# Patient Record
Sex: Female | Born: 1960 | Race: White | Hispanic: No | Marital: Single | Smoking: Never smoker
Health system: Southern US, Community
[De-identification: ages and names within clinical notes are randomized; demographics above are authoritative.]

## PROBLEM LIST (undated history)

## (undated) DIAGNOSIS — J45909 Unspecified asthma, uncomplicated: Secondary | ICD-10-CM

## (undated) DIAGNOSIS — K219 Gastro-esophageal reflux disease without esophagitis: Secondary | ICD-10-CM

## (undated) DIAGNOSIS — I10 Essential (primary) hypertension: Secondary | ICD-10-CM

## (undated) DIAGNOSIS — F419 Anxiety disorder, unspecified: Secondary | ICD-10-CM

## (undated) DIAGNOSIS — E119 Type 2 diabetes mellitus without complications: Secondary | ICD-10-CM

## (undated) DIAGNOSIS — M1990 Unspecified osteoarthritis, unspecified site: Secondary | ICD-10-CM

## (undated) HISTORY — PX: TONSILLECTOMY: SUR1361

## (undated) HISTORY — PX: OTHER SURGICAL HISTORY: SHX169

---

## 1999-08-04 ENCOUNTER — Ambulatory Visit (HOSPITAL_COMMUNITY): Admission: RE | Admit: 1999-08-04 | Discharge: 1999-08-04 | Payer: Self-pay | Admitting: Orthopaedic Surgery

## 2001-04-05 ENCOUNTER — Ambulatory Visit (HOSPITAL_BASED_OUTPATIENT_CLINIC_OR_DEPARTMENT_OTHER): Admission: RE | Admit: 2001-04-05 | Discharge: 2001-04-05 | Payer: Self-pay | Admitting: Orthopaedic Surgery

## 2005-09-24 ENCOUNTER — Encounter: Admission: RE | Admit: 2005-09-24 | Discharge: 2005-09-24 | Payer: Self-pay | Admitting: Orthopaedic Surgery

## 2005-10-19 ENCOUNTER — Encounter: Admission: RE | Admit: 2005-10-19 | Discharge: 2005-10-19 | Payer: Self-pay | Admitting: Orthopaedic Surgery

## 2005-11-05 ENCOUNTER — Encounter: Admission: RE | Admit: 2005-11-05 | Discharge: 2005-11-05 | Payer: Self-pay | Admitting: Orthopaedic Surgery

## 2005-11-26 ENCOUNTER — Encounter: Admission: RE | Admit: 2005-11-26 | Discharge: 2005-11-26 | Payer: Self-pay | Admitting: Orthopaedic Surgery

## 2006-04-20 HISTORY — PX: CHOLECYSTECTOMY: SHX55

## 2006-04-20 HISTORY — PX: OTHER SURGICAL HISTORY: SHX169

## 2006-10-05 ENCOUNTER — Encounter: Admission: RE | Admit: 2006-10-05 | Discharge: 2006-10-05 | Payer: Self-pay | Admitting: Orthopaedic Surgery

## 2006-10-25 ENCOUNTER — Encounter: Admission: RE | Admit: 2006-10-25 | Discharge: 2006-10-25 | Payer: Self-pay | Admitting: Orthopaedic Surgery

## 2006-11-08 ENCOUNTER — Encounter: Admission: RE | Admit: 2006-11-08 | Discharge: 2006-11-08 | Payer: Self-pay | Admitting: Orthopaedic Surgery

## 2007-01-18 ENCOUNTER — Encounter (INDEPENDENT_AMBULATORY_CARE_PROVIDER_SITE_OTHER): Payer: Self-pay | Admitting: General Surgery

## 2007-01-18 ENCOUNTER — Inpatient Hospital Stay (HOSPITAL_COMMUNITY): Admission: EM | Admit: 2007-01-18 | Discharge: 2007-01-20 | Payer: Self-pay | Admitting: Emergency Medicine

## 2007-03-28 ENCOUNTER — Emergency Department (HOSPITAL_COMMUNITY): Admission: EM | Admit: 2007-03-28 | Discharge: 2007-03-28 | Payer: Self-pay | Admitting: Family Medicine

## 2007-03-31 ENCOUNTER — Emergency Department (HOSPITAL_COMMUNITY): Admission: EM | Admit: 2007-03-31 | Discharge: 2007-03-31 | Payer: Self-pay | Admitting: Family Medicine

## 2007-06-10 ENCOUNTER — Encounter: Admission: RE | Admit: 2007-06-10 | Discharge: 2007-06-10 | Payer: Self-pay | Admitting: Orthopaedic Surgery

## 2007-07-01 ENCOUNTER — Encounter: Admission: RE | Admit: 2007-07-01 | Discharge: 2007-07-01 | Payer: Self-pay | Admitting: Orthopaedic Surgery

## 2007-07-15 ENCOUNTER — Encounter: Admission: RE | Admit: 2007-07-15 | Discharge: 2007-07-15 | Payer: Self-pay | Admitting: Orthopaedic Surgery

## 2008-01-12 ENCOUNTER — Encounter: Admission: RE | Admit: 2008-01-12 | Discharge: 2008-01-12 | Payer: Self-pay | Admitting: Orthopaedic Surgery

## 2008-01-25 ENCOUNTER — Encounter: Admission: RE | Admit: 2008-01-25 | Discharge: 2008-01-25 | Payer: Self-pay | Admitting: Orthopaedic Surgery

## 2008-01-30 ENCOUNTER — Encounter: Admission: RE | Admit: 2008-01-30 | Discharge: 2008-01-30 | Payer: Self-pay | Admitting: Orthopaedic Surgery

## 2008-06-17 IMAGING — CR DG CHEST 2V
2 series · 2 of 2 positions shown · non-contrast
Comparison: 01/18/07.

CLINICAL DATA: Chest tightness and shortness of breath.
 CHEST - 2 VIEW:

[view not recorded (1 of 2)]
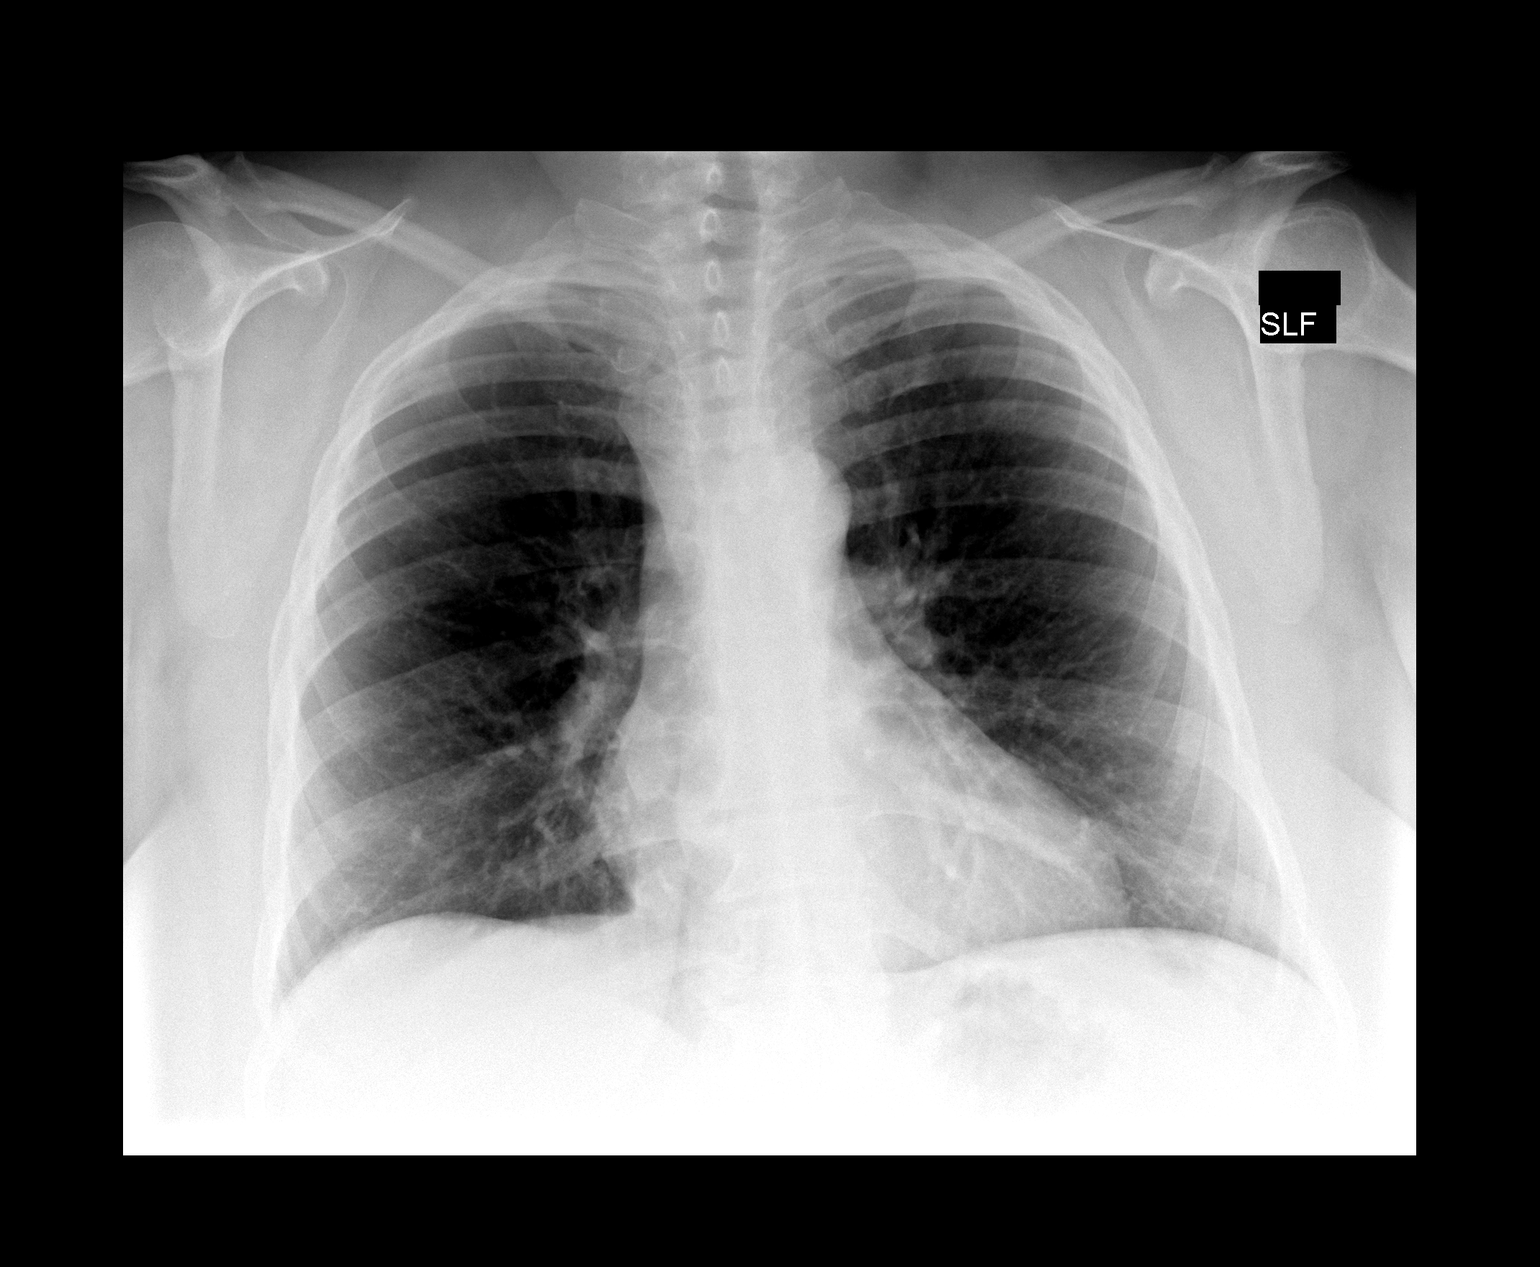

[view not recorded (2 of 2)]
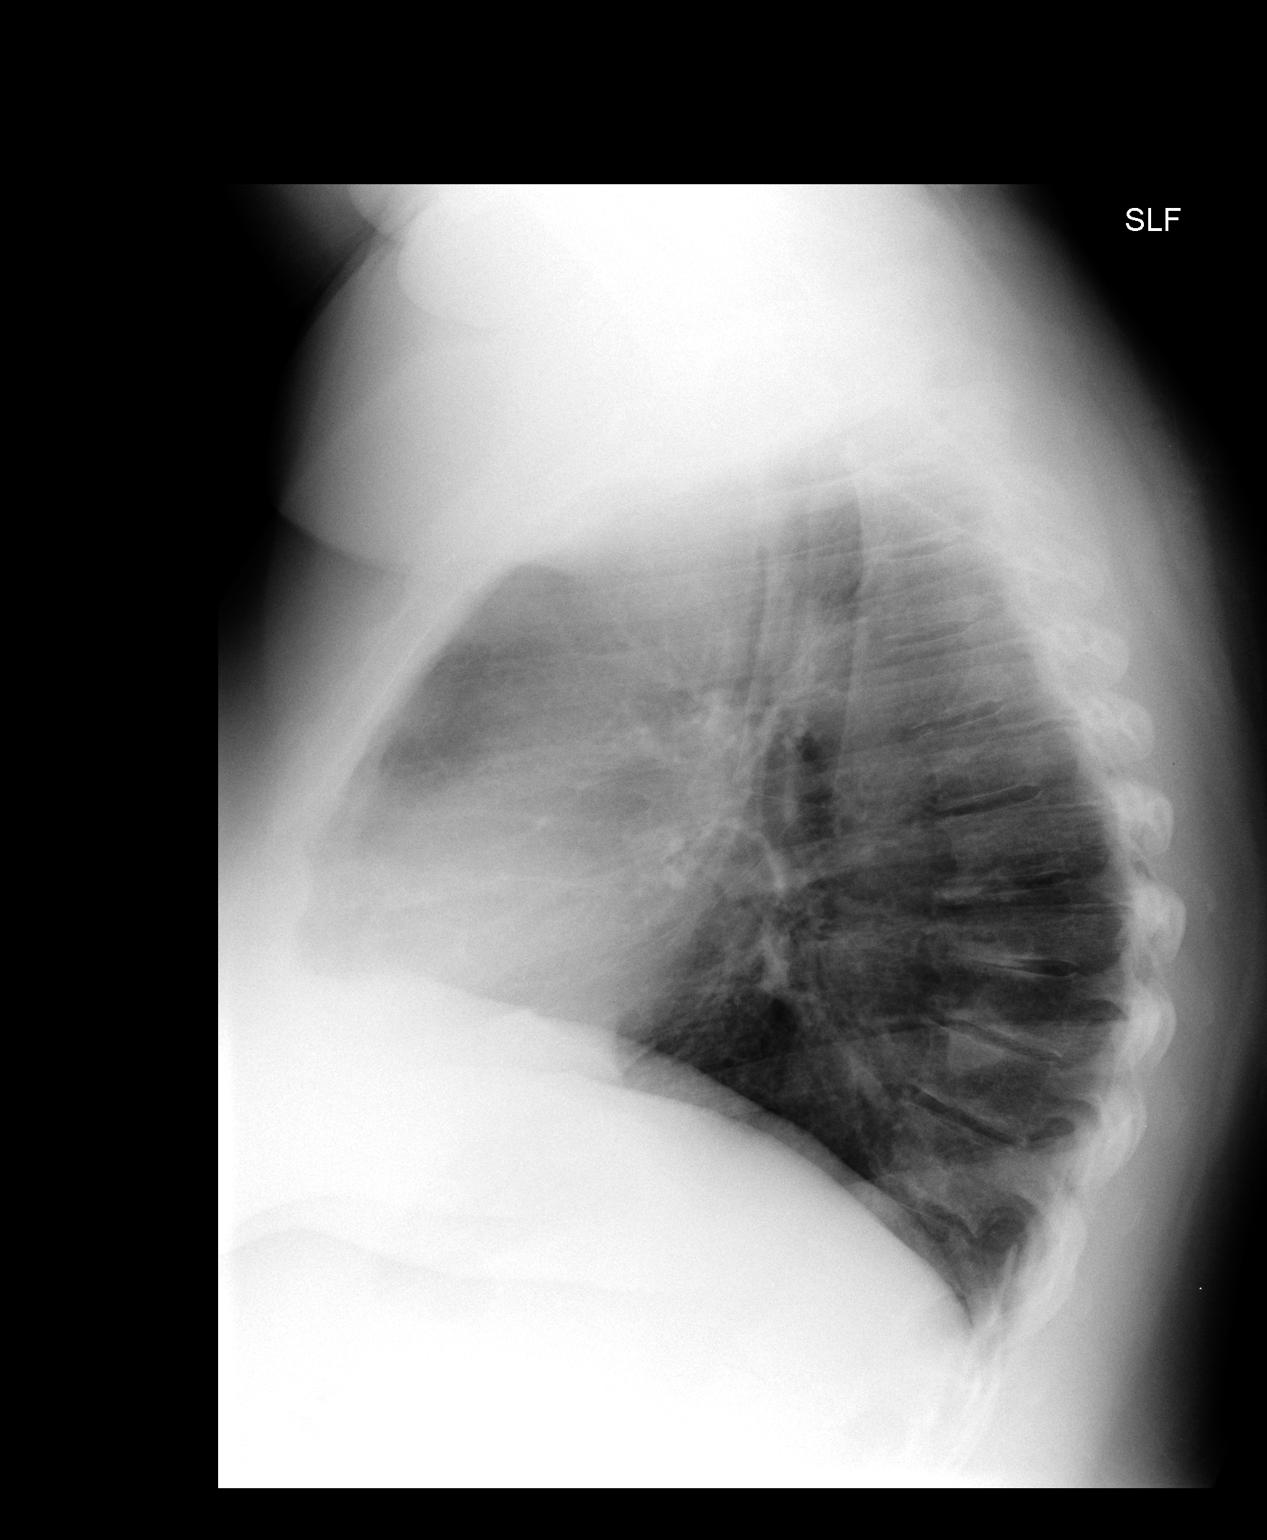

[2 of 2 positions shown; findings below may reference images not displayed]

FINDINGS: The lungs are clear. Heart size is normal. There is no pleural effusion or focal bony abnormality.
IMPRESSION: No acute disease.

## 2008-07-06 ENCOUNTER — Encounter: Admission: RE | Admit: 2008-07-06 | Discharge: 2008-07-06 | Payer: Self-pay | Admitting: Orthopaedic Surgery

## 2009-08-09 ENCOUNTER — Encounter: Admission: RE | Admit: 2009-08-09 | Discharge: 2009-08-09 | Payer: Self-pay | Admitting: Orthopaedic Surgery

## 2009-08-23 ENCOUNTER — Encounter: Admission: RE | Admit: 2009-08-23 | Discharge: 2009-08-23 | Payer: Self-pay | Admitting: Orthopaedic Surgery

## 2009-09-20 ENCOUNTER — Encounter: Admission: RE | Admit: 2009-09-20 | Discharge: 2009-09-20 | Payer: Self-pay | Admitting: Orthopaedic Surgery

## 2010-04-01 ENCOUNTER — Encounter
Admission: RE | Admit: 2010-04-01 | Discharge: 2010-04-01 | Payer: Self-pay | Source: Home / Self Care | Attending: Orthopaedic Surgery | Admitting: Orthopaedic Surgery

## 2010-05-11 ENCOUNTER — Encounter: Payer: Self-pay | Admitting: Orthopaedic Surgery

## 2010-05-11 ENCOUNTER — Encounter: Payer: Self-pay | Admitting: *Deleted

## 2010-05-13 ENCOUNTER — Encounter
Admission: RE | Admit: 2010-05-13 | Discharge: 2010-05-19 | Payer: Self-pay | Source: Home / Self Care | Attending: Orthopaedic Surgery | Admitting: Orthopaedic Surgery

## 2010-05-21 ENCOUNTER — Ambulatory Visit: Payer: BC Managed Care – PPO | Attending: Orthopaedic Surgery | Admitting: Physical Therapy

## 2010-05-21 DIAGNOSIS — M25569 Pain in unspecified knee: Secondary | ICD-10-CM | POA: Insufficient documentation

## 2010-05-21 DIAGNOSIS — M25669 Stiffness of unspecified knee, not elsewhere classified: Secondary | ICD-10-CM | POA: Insufficient documentation

## 2010-05-21 DIAGNOSIS — M545 Low back pain, unspecified: Secondary | ICD-10-CM | POA: Insufficient documentation

## 2010-05-21 DIAGNOSIS — IMO0001 Reserved for inherently not codable concepts without codable children: Secondary | ICD-10-CM | POA: Insufficient documentation

## 2010-05-22 ENCOUNTER — Ambulatory Visit: Payer: BC Managed Care – PPO | Admitting: Physical Therapy

## 2010-05-26 ENCOUNTER — Ambulatory Visit: Payer: BC Managed Care – PPO | Admitting: Physical Therapy

## 2010-05-28 ENCOUNTER — Ambulatory Visit: Payer: BC Managed Care – PPO | Admitting: Physical Therapy

## 2010-06-03 ENCOUNTER — Ambulatory Visit: Payer: BC Managed Care – PPO | Admitting: Physical Therapy

## 2010-06-05 ENCOUNTER — Ambulatory Visit: Payer: BC Managed Care – PPO | Admitting: Physical Therapy

## 2010-06-09 ENCOUNTER — Ambulatory Visit: Payer: BC Managed Care – PPO | Admitting: Rehabilitation

## 2010-06-09 ENCOUNTER — Ambulatory Visit: Payer: BC Managed Care – PPO | Admitting: Physical Therapy

## 2010-06-10 ENCOUNTER — Encounter: Payer: BC Managed Care – PPO | Admitting: Physical Therapy

## 2010-06-11 ENCOUNTER — Ambulatory Visit: Payer: BC Managed Care – PPO | Admitting: Rehabilitation

## 2010-06-13 ENCOUNTER — Encounter: Payer: BC Managed Care – PPO | Admitting: Physical Therapy

## 2010-06-16 ENCOUNTER — Other Ambulatory Visit: Payer: Self-pay | Admitting: Orthopaedic Surgery

## 2010-06-16 DIAGNOSIS — M5126 Other intervertebral disc displacement, lumbar region: Secondary | ICD-10-CM

## 2010-06-16 DIAGNOSIS — IMO0002 Reserved for concepts with insufficient information to code with codable children: Secondary | ICD-10-CM

## 2010-06-17 ENCOUNTER — Ambulatory Visit: Payer: BC Managed Care – PPO | Admitting: Physical Therapy

## 2010-06-18 ENCOUNTER — Ambulatory Visit
Admission: RE | Admit: 2010-06-18 | Discharge: 2010-06-18 | Disposition: A | Discharge status: Home / Self Care | Payer: BC Managed Care – PPO | Source: Ambulatory Visit | Attending: Orthopaedic Surgery | Admitting: Orthopaedic Surgery

## 2010-06-18 ENCOUNTER — Other Ambulatory Visit: Payer: Self-pay | Admitting: Orthopaedic Surgery

## 2010-06-18 ENCOUNTER — Encounter: Payer: BC Managed Care – PPO | Admitting: Rehabilitation

## 2010-06-18 DIAGNOSIS — IMO0002 Reserved for concepts with insufficient information to code with codable children: Secondary | ICD-10-CM

## 2010-06-18 DIAGNOSIS — M5126 Other intervertebral disc displacement, lumbar region: Secondary | ICD-10-CM

## 2010-06-18 DIAGNOSIS — M5136 Other intervertebral disc degeneration, lumbar region: Secondary | ICD-10-CM

## 2010-06-23 ENCOUNTER — Ambulatory Visit: Payer: BC Managed Care – PPO

## 2010-06-25 ENCOUNTER — Ambulatory Visit: Payer: BC Managed Care – PPO | Admitting: Rehabilitation

## 2010-06-30 ENCOUNTER — Ambulatory Visit: Payer: BC Managed Care – PPO | Attending: Orthopaedic Surgery | Admitting: Rehabilitation

## 2010-06-30 DIAGNOSIS — M545 Low back pain, unspecified: Secondary | ICD-10-CM | POA: Insufficient documentation

## 2010-06-30 DIAGNOSIS — M25569 Pain in unspecified knee: Secondary | ICD-10-CM | POA: Insufficient documentation

## 2010-06-30 DIAGNOSIS — IMO0001 Reserved for inherently not codable concepts without codable children: Secondary | ICD-10-CM | POA: Insufficient documentation

## 2010-06-30 DIAGNOSIS — M25669 Stiffness of unspecified knee, not elsewhere classified: Secondary | ICD-10-CM | POA: Insufficient documentation

## 2010-07-02 ENCOUNTER — Ambulatory Visit: Payer: BC Managed Care – PPO | Admitting: Physical Therapy

## 2010-07-03 ENCOUNTER — Ambulatory Visit
Admission: RE | Admit: 2010-07-03 | Discharge: 2010-07-03 | Disposition: A | Discharge status: Home / Self Care | Payer: BC Managed Care – PPO | Source: Ambulatory Visit | Attending: Orthopaedic Surgery | Admitting: Orthopaedic Surgery

## 2010-07-03 ENCOUNTER — Other Ambulatory Visit: Payer: Self-pay | Admitting: Orthopaedic Surgery

## 2010-07-03 DIAGNOSIS — IMO0002 Reserved for concepts with insufficient information to code with codable children: Secondary | ICD-10-CM

## 2010-07-03 DIAGNOSIS — M5136 Other intervertebral disc degeneration, lumbar region: Secondary | ICD-10-CM

## 2010-09-02 NOTE — H&P (Signed)
[name], [name]              ACCOUNT NO.:  [id number]   MEDICAL RECORD NO.:  [id number]          PATIENT TYPE:  INP   LOCATION:  5727                         FACILITY:  [facility]   PHYSICIAN:  Sandria Bales. Ezzard Standing, M.D.  DATE OF BIRTH:  02-26-1961   DATE OF ADMISSION:  01/17/2007  DATE OF DISCHARGE:                              HISTORY & PHYSICAL   HISTORY OF ILLNESS:  [name] is a 50 year old white female, patient  of Dr. Flonnie Overman of [facility] who had about two or three months  ago a sore throat. Was first treated with a Z-Pak, then treated with  some amoxicillin. Then, about two weeks ago, she started experiencing  abdominal pain of nonspecific etiology. It has been grumbling pain for 2  weeks. She was seen by Dr. Flonnie Overman who drew a blood count on her and noted  an elevated white blood count, and she was sent to the [facility]  emergency room for further evaluation.   A CT scan in the emergency room showed cholelithiasis with thickened  gallbladder wall consistent with cholecystitis, and I was contacted.   She has a history of gastroesophageal reflux disease, but she has never  had an upper GI or upper endoscopy. She has no history of known stomach  disease, liver disease, pancreatic disease or colon disease. She has had  no prior abdominal surgery.   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   Her current medications include:  1. Nexium.  2. Metformin 1000 mg b.i.d.  3. Hydrochlorothiazide.   REVIEW OF SYSTEMS:  NEUROLOGICAL:  She has no evidence of seizure or  loss of consciousness.  PULMONARY:  She has had a history of asthma, but she says it is under  good control right now. At one time, she was taking chronic steroids for  her asthma which lead to multiple orthopedic problems. CARDIAC:  She has  had hypertension diagnosed since August of 2008 but has had no chest  pain, no angina, no cardiac catheterization.  GASTROINTESTINAL:  See history of  present illness.  UROLOGIC:  She has had no urinary symptoms but was told she had a  urinary tract infection.  MUSCULOSKELETAL:  She has had multiple orthopedic operations involving  both shoulders, bilateral carpal tunnel. She had trouble with her right  hand with a dorsal laceration. She has had surgery. She has had  bilateral knee and ankle problems. Dr. Norlene Campbell has been her  orthopedic doctor, but she says it has been since almost 1991 since she  has really had any orthopedic surgery.   She is unmarried. Her mother is living. Her father is deceased. She  lives by herself. She works as a Animal nutritionist at [employer].   On physical exam, her pulse is 79, blood pressure 105/65, temperature  98.1. She is a well-nourished, obese, white female alert and cooperative  on physical exam.  Her HEENT is unremarkable.  Her neck is supple. I feel no mass or thyromegaly.  Her lungs are clear to auscultation with symmetric breath sounds.  Her heart has  a regular rate and rhythm. I hear no murmur or rub.  Her abdomen, she is obese which limits exam to some extent, but she is  sore in the epigastrium and right upper quadrant, but she has really no  guarding. No rebound. I feel no abdominal mass. I see no abdominal  scars.  EXTREMITIES:  She has scars over her wrists, but she has good strength  in upper and lower extremities.   Labs that I have show a white blood count 17,600 with a hemoglobin of 12  and hematocrit of 37, platelet count of 531,000. Her sodium was 135,  potassium 3.8, chloride 92, CO2 32, BUN 21, creatinine 1.1, glucose 288.  Her AST is 20, her ALT is 22, alkaline phosphatase is 142, total  bilirubin 0.6, lipase is 14, albumin 2.4. Urinalysis shows 21 to 50  white blood cells, nitrite positive.  Her CT scan which I reviewed with Dr. Charlett Nose shows a thickened  gallbladder wall with what appeared to be stones possibly in the neck of  the cystic duct. Her common bile  duct has a normal diameter. Her liver  and pancreas and other organs are grossly unremarkable.   IMPRESSION:  1. Acute/chronic cholecystitis. I placed her on IV antibiotics and IV      fluids n.p.o. Dr. Bertram Savin is the [facility] doctor of the week, and I      will discuss with her during daylight hours about proceeding with      surgery.  I talked to the patient about indications and potential complications of  gallbladder surgery. The potential complications I explained were  infection which I think she already has, bleeding, bowel injury, bile  duct injury as well as the possibility of open surgery. I think she  understands all of this.  1. Urinary tract infection. Antibiotics that cover her gallbladder      probably cover her urine pretty well.  2. Hypertension.  3. Noninsulin-dependent diabetes mellitus. She had apparently      hemoglobin A1c of about 10.5 two months ago.  4. History of asthma which is being controlled.  5. Remote history of steroid use which lead to multiple orthopedic      problems.  6. Obesity.      Sandria Bales. Ezzard Standing, M.D.  Electronically Signed     DHN/MEDQ  D:  01/18/2007  T:  01/18/2007  Job:  [id number]   cc:   Lucita Ferrara, MD  Claude Manges. Cleophas Dunker, M.D.

## 2010-09-02 NOTE — Op Note (Signed)
[name], [name]              ACCOUNT NO.:  [id number]   MEDICAL RECORD NO.:  [id number]          PATIENT TYPE:  INP   LOCATION:  5727                         FACILITY:  [facility]   PHYSICIAN:  Lennie Muckle, MD      DATE OF BIRTH:  Nov 03, 1960   DATE OF PROCEDURE:  01/18/2007  DATE OF DISCHARGE:                               OPERATIVE REPORT   PREOPERATIVE DIAGNOSIS:  Acute cholecystitis.   POSTOPERATIVE DIAGNOSIS:  Acute cholecystitis.   PROCEDURE:  Laparoscopic cholecystectomy with intraoperative  cholangiogram.   SURGEON:  Lennie Muckle, MD   ASSISTANT:  Velora Heckler, MD   SPECIMENS:  Gallbladder.   ESTIMATED BLOOD LOSS:  50 mL   COMPLICATIONS:  No immediate complications.   DRAINS:  A #10 Blake drain was placed within the gallbladder fossa.   INDICATIONS FOR PROCEDURE:  [name] had presented to the emergency  room with onset of right upper quadrant pain.  CT scan was obtained,  which found edematous gallbladder and a stone within the neck with  diagnosis consistent with acute cholecystitis.  Informed consent was  obtained prior to the procedure.   DETAILS OF PROCEDURE:  [name] was identified in the preoperative  holding area and taken to the operating suite where she was placed in  the supine position.  After administration of general endotracheal  anesthesia, her abdomen was prepped and draped in the usual sterile  fashion.  A time out procedure was performed to correctly idenify the  patient and the procedure. A Veress needle was introduced into the  abdominal cavity just beneath the left costochondral margin.  After  adequate insufflation, the Optiview was placed just above the umbilicus  to gain entry into the abdominal cavity.  A camera was placed inside the  abdominal cavity.  There is no evidence of injury upon entry with the  Veress needle or the trocar.  Three additional trocars were placed in  the abdominal wall, 2 in the left and right  costochondral margin, 1 at  the epigastric region.  The gallbladder was encased with omentum.  Using  blunt dissection the thick omental attachment was removed and a large  amount of purulent fluid was drained from the vicinity.  The gallbladder  was very friable and did have some separation from the gallbladder fossa  with manipulation.  Continued dissection proximally with the suction  dissector and blunt dissection brought me to the infundibulum where a  large stone was placed at the neck of the gallbladder.  It was somewhat  difficult to grasp the neck of the gallbladder; however, the fundus of  the gallbladder was able to be retracted up.  Careful dissection of the  peritoneum surrounding the infundibulum did reveal a rather large cystic  duct.  The cystic artery was able to be identified after careful  dissection, which was rather difficult given the adhesions.  There was  some amount of blood loss due to the large amount of inflammatory  changes.  However, the hepatic artery was able to be separated and  clearly visualized the cystic artery.  After fully dissecting around the  cystic duct, I elected to change the #5 trocar at the epigastric region  to a #10 mm trocar.  A clip was placed proximally on the cystic duct and  then partially transected with the laparoscopic scissors.  The [facility]  catheter was then introduced into the cystic duct for the cholangiogram.  Cholangiogram revealed a patent cystic duct and the common bile duct was  clearly identified, as well as the right and left hepatic ducts.  Two  clips were then placed proximally on the cystic duct.  It was fully  transected with the laparoscopic scissors.  The cystic artery was then  clipped proximally and distally and carefully dissected away from the  surrounding tissues.  It was then transected with the laparoscopic  scissors.  Remaining attachments were taken down with electrocautery.  The gallbladder was then placed in  the catch bag and removed from the  supraumbilical incision.  The area was then irrigated with 1.5 liters of  saline.  Due to the amount of oozing of the liver bed during the  dissection, elected to place the #10 flat drain within the gallbladder  bed.  There was no evidence of bile leakage or bleeding at the end of  the case.  The drain was secured with a 3-0 nylon suture.  The fascial  defect was closed with 0 Vicryl suture just above the umbilicus.  The  skin was closed with 4-0 Monocryl and Steri-Strips were placed without a  dressing.  Patient was then extubated and transferred to the  postanesthesia care unit in stable condition.      Lennie Muckle, MD  Electronically Signed     ALA/MEDQ  D:  01/18/2007  T:  01/18/2007  Job:  [id number]

## 2010-09-05 NOTE — Op Note (Signed)
[place]  Patient:    ZURIEL, YEAMAN Visit Number: [id number] MRN: [id number]          Service Type: Attending:  Claude Manges. Cleophas Dunker, M.D. Dictated by:   Claude Manges. Cleophas Dunker, M.D. Proc. Date: 04/05/01                             Operative Report  PREOPERATIVE DIAGNOSIS: 1. Impingement right shoulder with partial rotator cuff tear. 2. Retained fusion plate, right wrist.  POSTOPERATIVE DIAGNOSIS: 1. Impingement right shoulder with partial rotator cuff tear. 2. Retained fusion plate, right wrist.  OPERATION PERFORMED: 1. Arthroscopic debridement right shoulder including anterior glenoid labrum    and partial rotator cuff tears. 2. Arthroscopic subacromial decompression. 3. Removal of a plate from dorsum of right wrist.  SURGEON:  Claude Manges. Cleophas Dunker, M.D.  ANESTHESIA:  General orotracheal.  COMPLICATIONS:  None.  INDICATIONS FOR PROCEDURE:  The patient is a 50 year old female has been followed for problems with her right shoulder since about July  of this year. she has evidence of impingement clinically with an MRI scan demonstrating a high grade partial tear involving the articular surface of the infra and supraspinatus tendons without evidence of a full thickness perforation.  There was no evidence of obvious bony impingement although clinically she has impingement.  There was an intact glenoid labrum and glenohumeral ligaments. She has had cortisone anti-inflammatory medicines but continues to have pain and did not have arthroscopic evaluation.  She has had a successful arthroscopic procedure on her left shoulder.  She also is status post fusion of her right wrist by Dr. Metro Kung and has a painful plate and that plate is to be removed per the previous discussion with Dr. Metro Kung.  DESCRIPTION OF PROCEDURE:  With the patient comfortable on the operating table and under general orotracheal anesthesia, the patient was placed in  a semisitting position and secured to the operating table with the shoulder frame.  The right upper extremity was prepped from the tips of the fingers circumferentially to the base of the neck with DuraPrep.  Sterile draping was performed.  Initial procedure was performed on the shoulder.  A marking pen was used to outline the acromion, the acromioclavicular joint, the coracoid.  A point about a fingerbreadth and a half inferior and medial to the posterior angle of the acromion, a small stab wound was made, prior to which, 0.25% Marcaine with epinephrine was injected.  The arthroscope was easily placed into the shoulder.  Arthroscopic evaluation revealed fraying of the anterior glenoid ligament but without evidence of tearing.  The humeral head was intact.  There was some chondromalacia of the glenoid.  Biceps tendon was intact.  The posterior labrum appeared to be intact.  There were obvious partial tears of the rotator cuff around the biceps tendon around the attachment to the humeral head involving what appeared to be at least supraspinatus and possibly infraspinatus.  These were shaved.  A second portal was established anteriorly and shaving was performed of the shoulder joint at the labrum as well as the partial rotator cuff tears.  The arthroscope was then placed to the subacromial space posteriorly.  The cannula anteriorly and a third portal established in the lateral subacromial space.  An arthroscopic subacromial decompression was then performed using a shaver as well as the Arthrocare wand.  There were some spurs anteriorly which I removed.  I thought I had an excellent decompression  using a 6 mm bur.  The three stab wounds were closed with interrupted 4-0 Ethilon and the wounds infiltrated with 0.25% Marcaine with epinephrine.  A sterile bulky dressing was applied.  A sterile tourniquet was applied to the right upper extremity.  The extremity was elevated and Esmarch  exsanguinated with the proximal tourniquet at 280 mHg.  The previous dorsal incision was utilized and via sharp dissection carried out to subcutaneous tissues.  The incision was extended to the distal metaphysis of the long finger and then proximally in the midline of the wrist along the incisional lines.  Then by blunt dissection, the soft tissue was elevated off the muscle proximally and the tendons distally.  An interval between the ring and long fingers was identified and then carefully incised. The plate was identified.  There were 8 screws.  The distal 7 were removed, the 8th stripped and then I had some difficult removing the head so that the plate could be removed.  The most proximal hole had not been filled with the screw.  I used Dremel bit to remove the head of the screw and then using an osteotome, it was eventually removed along with the plate leaving the threaded portion of the screw in the radius.  The sharp edge was removed to prevent bursa formation and thus pain.  The wound was irrigated with saline and I did not really see any appreciable remaining pieces of metal.  The tendon sheaths were closed with 3-0 and 2-0 Vicryl, the subcutaneous closed with similar material and the skin closed with running 3-0 subcuticular Prolene with Steri-Strips over benzoin.  The wound was infiltrated with 0.25% Marcaine without epinephrine.  Sterile bulky dressing was applied with an Ace bandage and a volar wrist splint and a sling.  The patient tolerated the procedure without complications.  PLAN:  Mepergan Fortis for pain, office one week. Dictated by:   Claude Manges. Cleophas Dunker, M.D. Attending:  Claude Manges. Cleophas Dunker, M.D. DD:  04/05/01 TD:  04/05/01 Job: [id number]

## 2010-09-05 NOTE — Discharge Summary (Signed)
NAMELAPORSHIA, HOGEN              ACCOUNT NO.:  [id number]   MEDICAL RECORD NO.:  [id number]          PATIENT TYPE:  INP   LOCATION:  5727                         FACILITY:  [facility]   PHYSICIAN:  Lennie Muckle, MD      DATE OF BIRTH:  December 06, 1960   DATE OF ADMISSION:  01/17/2007  DATE OF DISCHARGE:  01/20/2007                               DISCHARGE SUMMARY   DISCHARGING PHYSICIAN:  Dr. Freida Busman.   OPERATING PHYSICIAN:  Dr. Freida Busman.   CHIEF COMPLAINT/REASON FOR ADMISSION:  Ms. Spelman is a 50 year old  female patient of Dr. Shea Stakes at [facility]. She has had  nonspecific abdominal pain for about 2 weeks. She went to see Dr. Flonnie Overman  and she was found to have an elevated white count.  She was sent to  [facility] ER for further evaluation.  CT scan demonstrated  cholelithiasis, thickened gall bladder wall and fluid consistent with  acute cholecystitis.  The patient does have a history of GERD but has  never had upper endoscopy done.  She is already on Nexium. She also has  significant history of diabetes and hypertension. On exam her vital  signs were stable. Her abdomen is obese but she sore in the epigastrium  and right upper quadrant with no guarding.  Her white count was 17,600.  LFTs were normal.  Lipase was normal. Dr. Ezzard Standing did review the CT scan  with the radiologist and it looked like she may have stones in the neck  of the cystic duct. The common bile duct was normal diameter. Therefore  the patient was admitted with the following diagnoses:   ADMISSION DIAGNOSES:  1. Acute on chronic cholecystitis.  2. Urinary tract infection based on abnormal urinalysis in the ER.  3. Hypertension, diabetes and obesity.   HOSPITAL COURSE:  The patient was admitted by Dr. Ezzard Standing where she was  placed on n.p.o. status, IV antibiotics and plans were to proceed with  surgical intervention later in the week.   Dr. Bertram Savin assumed care of the patient after Dr. Ezzard Standing admitted  the  patient and the patient was subsequently taken to the OR on  January 18, 2007. Preoperative diagnosis of acute cholecystitis.  Postoperative diagnosis the same.  She underwent a laparoscopic  cholecystectomy with intraoperative cholangiogram. This showed no  problems and the patient was sent back to the general floor to recover.   In the postoperative period, the patient did well except for issues  related to pain control. She did have an elevated hemoglobin A1c of  10.8. She was having itching related to the PCA and did have urinary  retention that required Foley catheter placement. Postoperatively her  abdomen was soft, bowel sounds were present.  She had a JP drain in  place that had bloody drainage. On postop day #1, the patient was  switched to oral pain medications per her request because of the itching  from the PCA. Toradol was added and Zofran was added as well.  By  postoperative day #2, the patient was stable, afebrile, tolerating an  oral diet.  Abdomen was  benign and Dr. Freida Busman felt she was appropriate  for discharge home.   FINAL DISCHARGE DIAGNOSES:  1. Abdominal pain secondary to acute gangrenous cholecystitis.  2. Status post laparoscopic cholecystectomy with negative      intraoperative cholangiogram.  3. Other chronic medical problems as noted.   DISCHARGE MEDICATIONS:  The patient will resume the following home  medications.  1. Benicar 20 mg daily.  2. Hydrochlorothiazide 25 mg daily.  3. Lexapro 40 mg daily.  4. Lipitor 20 mg daily.  5. Metformin 1000 mg b.i.d.  6. Nexium 40 mg daily.  7. Lexapro daily.  8. Albuterol metered dose inhaler as needed.   NEW MEDICATIONS:  1. Percocet 5/325 1-2 tablets every 4 hours as needed for pain.  2. Over-the-counter stool softener as needed for constipation.   DIET:  No restrictions.   WOUND CARE:  Allow any Steri-Strips to fall off.  JP drain was  discontinued prior to being sent home.   ACTIVITY:  Increase  activity slowly.  No lifting for 6 weeks greater  than 20 pounds.   FOLLOW-UP:  She needs to see Dr. Freida Busman in 2 weeks.  She needs to call  for an appointment.  She also needs to follow up with her primary care  physician in 1 week.      Allison L. Kennith Center, MD  Electronically Signed    ALE/MEDQ  D:  02/16/2007  T:  02/17/2007  Job:  [id number]

## 2010-11-21 ENCOUNTER — Other Ambulatory Visit: Payer: Self-pay | Admitting: Orthopaedic Surgery

## 2010-11-21 DIAGNOSIS — IMO0002 Reserved for concepts with insufficient information to code with codable children: Secondary | ICD-10-CM

## 2010-11-25 ENCOUNTER — Ambulatory Visit
Admission: RE | Admit: 2010-11-25 | Discharge: 2010-11-25 | Disposition: A | Discharge status: Home / Self Care | Payer: BC Managed Care – PPO | Source: Ambulatory Visit | Attending: Orthopaedic Surgery | Admitting: Orthopaedic Surgery

## 2010-11-25 DIAGNOSIS — IMO0002 Reserved for concepts with insufficient information to code with codable children: Secondary | ICD-10-CM

## 2010-11-25 MED ORDER — METHYLPREDNISOLONE ACETATE 40 MG/ML INJ SUSP (RADIOLOG
120.0000 mg | Freq: Once | INTRAMUSCULAR | Status: AC
  Administered 2010-11-25: 120 mg via EPIDURAL

## 2010-11-25 MED ORDER — IOHEXOL 180 MG/ML  SOLN
1.0000 mL | Freq: Once | INTRAMUSCULAR | Status: AC | PRN
  Administered 2010-11-25: 1 mL via EPIDURAL

## 2011-01-29 LAB — BASIC METABOLIC PANEL
BUN: 16
Creatinine, Ser: 0.81
GFR calc non Af Amer: >60
Glucose, Bld: 279 — ABNORMAL HIGH
Potassium: 3.6

## 2011-01-29 LAB — DIFFERENTIAL
Basophils Absolute: 0.1
Eosinophils Absolute: 0.1
Eosinophils Relative: 1
Lymphocytes Relative: 15
Lymphs Abs: 2.7
Neutrophils Relative %: 78 — ABNORMAL HIGH

## 2011-01-29 LAB — CBC
HCT: 37.4
Platelets: 531 — ABNORMAL HIGH
RDW: 13.8
WBC: 17.6 — ABNORMAL HIGH

## 2011-01-29 LAB — URINALYSIS, ROUTINE W REFLEX MICROSCOPIC
Hgb urine dipstick: NEGATIVE
Nitrite: POSITIVE — AB
Protein, ur: 100 — AB
Urobilinogen, UA: 1

## 2011-01-29 LAB — COMPREHENSIVE METABOLIC PANEL
ALT: 22
AST: 20
Albumin: 2.4 — ABNORMAL LOW
Alkaline Phosphatase: 142 — ABNORMAL HIGH
GFR calc Af Amer: 59 — ABNORMAL LOW
Potassium: 3.8
Sodium: 135
Total Protein: 6.2

## 2011-01-29 LAB — HEPATIC FUNCTION PANEL
ALT: 24
Albumin: 2.7 — ABNORMAL LOW
Indirect Bilirubin: 0.4
Total Protein: 7.1

## 2011-01-29 LAB — HEMOGLOBIN A1C: Hgb A1c MFr Bld: 10.8 — ABNORMAL HIGH

## 2011-01-29 LAB — URINE CULTURE
Colony Count: NO GROWTH
Culture: NO GROWTH

## 2011-01-29 LAB — SAMPLE TO BLOOD BANK

## 2011-03-30 ENCOUNTER — Other Ambulatory Visit: Payer: Self-pay | Admitting: Orthopaedic Surgery

## 2011-03-30 DIAGNOSIS — M545 Low back pain, unspecified: Secondary | ICD-10-CM

## 2011-04-01 ENCOUNTER — Ambulatory Visit
Admission: RE | Admit: 2011-04-01 | Discharge: 2011-04-01 | Disposition: A | Discharge status: Home / Self Care | Payer: BC Managed Care – PPO | Source: Ambulatory Visit | Attending: Orthopaedic Surgery | Admitting: Orthopaedic Surgery

## 2011-04-01 DIAGNOSIS — M545 Low back pain, unspecified: Secondary | ICD-10-CM

## 2011-04-03 ENCOUNTER — Other Ambulatory Visit: Payer: Self-pay | Admitting: Orthopaedic Surgery

## 2011-04-03 DIAGNOSIS — M5136 Other intervertebral disc degeneration, lumbar region: Secondary | ICD-10-CM

## 2011-04-06 ENCOUNTER — Ambulatory Visit
Admission: RE | Admit: 2011-04-06 | Discharge: 2011-04-06 | Disposition: A | Discharge status: Home / Self Care | Payer: BC Managed Care – PPO | Source: Ambulatory Visit | Attending: Orthopaedic Surgery | Admitting: Orthopaedic Surgery

## 2011-04-06 DIAGNOSIS — M5136 Other intervertebral disc degeneration, lumbar region: Secondary | ICD-10-CM

## 2011-04-06 MED ORDER — METHYLPREDNISOLONE ACETATE 40 MG/ML INJ SUSP (RADIOLOG
120.0000 mg | Freq: Once | INTRAMUSCULAR | Status: AC
  Administered 2011-04-06: 120 mg via EPIDURAL

## 2011-04-06 MED ORDER — IOHEXOL 180 MG/ML  SOLN
1.0000 mL | Freq: Once | INTRAMUSCULAR | Status: AC | PRN
  Administered 2011-04-06: 1 mL via EPIDURAL

## 2013-07-31 ENCOUNTER — Other Ambulatory Visit: Payer: Self-pay | Admitting: Gastroenterology

## 2013-08-14 ENCOUNTER — Encounter (HOSPITAL_COMMUNITY): Payer: Self-pay | Admitting: Pharmacy Technician

## 2013-08-22 ENCOUNTER — Encounter (HOSPITAL_COMMUNITY): Payer: Self-pay | Admitting: *Deleted

## 2013-09-05 ENCOUNTER — Encounter (HOSPITAL_COMMUNITY): Payer: Self-pay | Admitting: *Deleted

## 2013-09-05 ENCOUNTER — Ambulatory Visit (HOSPITAL_COMMUNITY): Payer: BC Managed Care – PPO | Admitting: Anesthesiology

## 2013-09-05 ENCOUNTER — Encounter (HOSPITAL_COMMUNITY): Payer: BC Managed Care – PPO | Admitting: Anesthesiology

## 2013-09-05 ENCOUNTER — Ambulatory Visit (HOSPITAL_COMMUNITY)
Admission: RE | Admit: 2013-09-05 | Discharge: 2013-09-05 | Disposition: A | Discharge status: Home / Self Care | Payer: BC Managed Care – PPO | Source: Ambulatory Visit | Attending: Gastroenterology | Admitting: Gastroenterology

## 2013-09-05 ENCOUNTER — Encounter (HOSPITAL_COMMUNITY)
Admission: RE | Disposition: A | Discharge status: Home / Self Care | Payer: Self-pay | Source: Ambulatory Visit | Attending: Gastroenterology

## 2013-09-05 DIAGNOSIS — Z887 Allergy status to serum and vaccine status: Secondary | ICD-10-CM | POA: Insufficient documentation

## 2013-09-05 DIAGNOSIS — E119 Type 2 diabetes mellitus without complications: Secondary | ICD-10-CM | POA: Insufficient documentation

## 2013-09-05 DIAGNOSIS — R1013 Epigastric pain: Secondary | ICD-10-CM | POA: Insufficient documentation

## 2013-09-05 DIAGNOSIS — R12 Heartburn: Secondary | ICD-10-CM | POA: Insufficient documentation

## 2013-09-05 DIAGNOSIS — IMO0002 Reserved for concepts with insufficient information to code with codable children: Secondary | ICD-10-CM | POA: Insufficient documentation

## 2013-09-05 DIAGNOSIS — Z888 Allergy status to other drugs, medicaments and biological substances status: Secondary | ICD-10-CM | POA: Insufficient documentation

## 2013-09-05 DIAGNOSIS — M1710 Unilateral primary osteoarthritis, unspecified knee: Secondary | ICD-10-CM | POA: Insufficient documentation

## 2013-09-05 DIAGNOSIS — Z91010 Allergy to peanuts: Secondary | ICD-10-CM | POA: Insufficient documentation

## 2013-09-05 DIAGNOSIS — F458 Other somatoform disorders: Secondary | ICD-10-CM | POA: Insufficient documentation

## 2013-09-05 DIAGNOSIS — F411 Generalized anxiety disorder: Secondary | ICD-10-CM | POA: Insufficient documentation

## 2013-09-05 DIAGNOSIS — Z91012 Allergy to eggs: Secondary | ICD-10-CM | POA: Insufficient documentation

## 2013-09-05 DIAGNOSIS — Z1211 Encounter for screening for malignant neoplasm of colon: Secondary | ICD-10-CM | POA: Insufficient documentation

## 2013-09-05 DIAGNOSIS — E7800 Pure hypercholesterolemia, unspecified: Secondary | ICD-10-CM | POA: Insufficient documentation

## 2013-09-05 DIAGNOSIS — J45909 Unspecified asthma, uncomplicated: Secondary | ICD-10-CM | POA: Insufficient documentation

## 2013-09-05 DIAGNOSIS — I10 Essential (primary) hypertension: Secondary | ICD-10-CM | POA: Insufficient documentation

## 2013-09-05 HISTORY — DX: Essential (primary) hypertension: I10

## 2013-09-05 HISTORY — DX: Type 2 diabetes mellitus without complications: E11.9

## 2013-09-05 HISTORY — DX: Unspecified osteoarthritis, unspecified site: M19.90

## 2013-09-05 HISTORY — PX: COLONOSCOPY WITH PROPOFOL: SHX5780

## 2013-09-05 HISTORY — DX: Unspecified asthma, uncomplicated: J45.909

## 2013-09-05 HISTORY — PX: ESOPHAGOGASTRODUODENOSCOPY (EGD) WITH PROPOFOL: SHX5813

## 2013-09-05 HISTORY — DX: Gastro-esophageal reflux disease without esophagitis: K21.9

## 2013-09-05 HISTORY — DX: Anxiety disorder, unspecified: F41.9

## 2013-09-05 LAB — GLUCOSE, CAPILLARY: GLUCOSE-CAPILLARY: 220 mg/dL — AB (ref 70–99)

## 2013-09-05 SURGERY — ESOPHAGOGASTRODUODENOSCOPY (EGD) WITH PROPOFOL
Anesthesia: Monitor Anesthesia Care

## 2013-09-05 MED ORDER — PROPOFOL 10 MG/ML IV BOLUS
INTRAVENOUS | Status: AC
  Filled 2013-09-05: qty 20

## 2013-09-05 MED ORDER — PROPOFOL 10 MG/ML IV BOLUS
INTRAVENOUS | Status: DC | PRN
  Administered 2013-09-05 (×4): 25 mg via INTRAVENOUS
  Administered 2013-09-05 (×2): 50 mg via INTRAVENOUS

## 2013-09-05 MED ORDER — ONDANSETRON HCL 4 MG/2ML IJ SOLN
INTRAMUSCULAR | Status: AC
  Filled 2013-09-05: qty 2

## 2013-09-05 MED ORDER — BUTAMBEN-TETRACAINE-BENZOCAINE 2-2-14 % EX AERO
INHALATION_SPRAY | CUTANEOUS | Status: DC | PRN
  Administered 2013-09-05: 1 via TOPICAL

## 2013-09-05 MED ORDER — PROMETHAZINE HCL 25 MG/ML IJ SOLN: 6.2500 mg | INTRAMUSCULAR | Status: DC | PRN

## 2013-09-05 MED ORDER — LACTATED RINGERS IV SOLN
INTRAVENOUS | Status: DC
  Administered 2013-09-05: 1000 mL via INTRAVENOUS

## 2013-09-05 MED ORDER — SODIUM CHLORIDE 0.9 % IV SOLN: INTRAVENOUS | Status: DC

## 2013-09-05 MED ORDER — LACTATED RINGERS IV SOLN
INTRAVENOUS | Status: DC | PRN
  Administered 2013-09-05: 08:00:00 via INTRAVENOUS

## 2013-09-05 MED ORDER — ONDANSETRON HCL 4 MG/2ML IJ SOLN
INTRAMUSCULAR | Status: DC | PRN
  Administered 2013-09-05: 4 mg via INTRAVENOUS

## 2013-09-05 SURGICAL SUPPLY — 25 items

## 2013-09-05 NOTE — Anesthesia Preprocedure Evaluation (Signed)
Anesthesia Evaluation  Patient identified by MRN, date of birth, ID band Patient awake    Reviewed: Allergy & Precautions, H&P , NPO status , Patient's Chart, lab work & pertinent test results  Airway Mallampati: II TM Distance: >3 FB Neck ROM: Full    Dental no notable dental hx.    Pulmonary asthma ,  breath sounds clear to auscultation  Pulmonary exam normal       Cardiovascular hypertension, Pt. on medications Rhythm:Regular Rate:Normal     Neuro/Psych negative neurological ROS  negative psych ROS   GI/Hepatic Neg liver ROS, GERD-  Medicated,  Endo/Other  diabetes, Insulin Dependent  Renal/GU negative Renal ROS  negative genitourinary   Musculoskeletal negative musculoskeletal ROS (+)   Abdominal   Peds negative pediatric ROS (+)  Hematology negative hematology ROS (+)   Anesthesia Other Findings   Reproductive/Obstetrics negative OB ROS                           Anesthesia Physical Anesthesia Plan  ASA: III  Anesthesia Plan: MAC   Post-op Pain Management:    Induction: Intravenous  Airway Management Planned: Nasal Cannula  Additional Equipment:   Intra-op Plan:   Post-operative Plan:   Informed Consent: I have reviewed the patients History and Physical, chart, labs and discussed the procedure including the risks, benefits and alternatives for the proposed anesthesia with the patient or authorized representative who has indicated his/her understanding and acceptance.     Plan Discussed with: CRNA and Surgeon  Anesthesia Plan Comments:         Anesthesia Quick Evaluation

## 2013-09-05 NOTE — Op Note (Signed)
Problem: Epigastric discomfort. Heartburn. Globus sensation.  Endoscopist: Danise EdgeMartin Stair  Premedication: Propofol 200 mg  Procedure: Diagnostic esophagogastroduodenoscopy The patient was placed in the left lateral decubitus position. The Pentax gastroscope was passed through the posterior hypopharynx into the proximal esophagus without difficulty. The hypopharynx, larynx, and vocal cords appeared normal.  Esophagoscopy: The proximal, mid, and lower segments of the esophageal mucosa appeared normal. The squamocolumnar junction is regular and noted at 35 cm from the incisor teeth. There is no endoscopic evidence for the presence of Barrett's esophagus or erosive esophagitis.  Gastroscopy: Retroflex view of the gastric cardia and fundus was normal. The gastric body, antrum, and pylorus appeared normal.  Duodenoscopy: The duodenal bulb and descending duodenum appeared normal.  Assessment: Normal esophagogastroduodenoscopy  Procedure: Baseline screening colonoscopy Anal inspection and digital rectal exam were normal. The Pentax pediatric colonoscope was introduced into the rectum and easily advanced to the cecum. A normal-appearing ileocecal valve and appendiceal orifice were identified. Colonic preparation for the exam today was good.  Rectum. Normal. Retroflexed view of the distal rectum normal  Sigmoid colon and descending colon. Normal  Splenic flexure. Normal  Transverse colon. Normal  Hepatic flexure. Normal  Ascending colon. Normal  Cecum and ileocecal valve. Normal  Assessment: Normal baseline screening colonoscopy  Recommendation: Schedule repeat screening colonoscopy in 10 years

## 2013-09-05 NOTE — Transfer of Care (Signed)
Immediate Anesthesia Transfer of Care Note  Patient: [name]  Procedure(s) Performed: Procedure(s): ESOPHAGOGASTRODUODENOSCOPY (EGD) WITH PROPOFOL (N/A) COLONOSCOPY WITH PROPOFOL (N/A)  Patient Location: PACU  Anesthesia Type:MAC  Level of Consciousness: awake, alert  and patient cooperative  Airway & Oxygen Therapy: Patient Spontanous Breathing and Patient connected to nasal cannula oxygen  Post-op Assessment: Report given to PACU RN and Post -op Vital signs reviewed and stable  Post vital signs: Reviewed and stable  Complications: No apparent anesthesia complications

## 2013-09-05 NOTE — Discharge Instructions (Addendum)
Gastrointestinal Endoscopy °Care After °Refer to this sheet in the next few weeks. These instructions provide you with information on caring for yourself after your procedure. Your caregiver may also give you more specific instructions. Your treatment has been planned according to current medical practices, but problems sometimes occur. Call your caregiver if you have any problems or questions after your procedure. °HOME CARE INSTRUCTIONS °· If you were given medicine to help you relax (sedative), do not drive, operate machinery, or sign important documents for 24 hours. °· Avoid alcohol and hot or warm beverages for the first 24 hours after the procedure. °· Only take over-the-counter or prescription medicines for pain, discomfort, or fever as directed by your caregiver. You may resume taking your normal medicines unless your caregiver tells you otherwise. Ask your caregiver when you may resume taking medicines that may cause bleeding, such as aspirin, clopidogrel, or warfarin. °· You may return to your normal diet and activities on the day after your procedure, or as directed by your caregiver. Walking may help to reduce any bloated feeling in your abdomen. °· Drink enough fluids to keep your urine clear or pale yellow. °· You may gargle with salt water if you have a sore throat. °SEEK IMMEDIATE MEDICAL CARE IF: °· You have severe nausea or vomiting. °· You have severe abdominal pain, abdominal cramps that last longer than 6 hours, or abdominal swelling (distention). °· You have severe shoulder or back pain. °· You have trouble swallowing. °· You have shortness of breath, your breathing is shallow, or you are breathing faster than normal. °· You have a fever or a rapid heartbeat. °· You vomit blood or material that looks like coffee grounds. °· You have bloody, black, or tarry stools. °MAKE SURE YOU: °· Understand these instructions. °· Will watch your condition. °· Will get help right away if you are not doing  well or get worse. °Document Released: 11/19/2003 Document Revised: 10/06/2011 Document Reviewed: 07/07/2011 °[employer]® Patient Information ©2014 [employer]. ° °Colonoscopy, Care After °Refer to this sheet in the next few weeks. These instructions provide you with information on caring for yourself after your procedure. Your health care provider may also give you more specific instructions. Your treatment has been planned according to current medical practices, but problems sometimes occur. Call your health care provider if you have any problems or questions after your procedure. °WHAT TO EXPECT AFTER THE PROCEDURE  °After your procedure, it is typical to have the following: °· A small amount of blood in your stool. °· Moderate amounts of gas and mild abdominal cramping or bloating. °HOME CARE INSTRUCTIONS °· Do not drive, operate machinery, or sign important documents for 24 hours. °· You may shower and resume your regular physical activities, but move at a slower pace for the first 24 hours. °· Take frequent rest periods for the first 24 hours. °· Walk around or put a warm pack on your abdomen to help reduce abdominal cramping and bloating. °· Drink enough fluids to keep your urine clear or pale yellow. °· You may resume your normal diet as instructed by your health care provider. Avoid heavy or fried foods that are hard to digest. °· Avoid drinking alcohol for 24 hours or as instructed by your health care provider. °· Only take over-the-counter or prescription medicines as directed by your health care provider. °· If a tissue sample (biopsy) was taken during your procedure: °· Do not take aspirin or blood thinners for 7 days, or as instructed   by your health care provider. °· Do not drink alcohol for 7 days, or as instructed by your health care provider. °· Eat soft foods for the first 24 hours. °SEEK MEDICAL CARE IF: °You have persistent spotting of blood in your stool 2 3 days after the procedure. °SEEK  IMMEDIATE MEDICAL CARE IF: °· You have more than a small spotting of blood in your stool. °· You pass large blood clots in your stool. °· Your abdomen is swollen (distended). °· You have nausea or vomiting. °· You have a fever. °· You have increasing abdominal pain that is not relieved with medicine. °Document Released: 11/19/2003 Document Revised: 01/25/2013 Document Reviewed: 12/12/2012 °[employer]® Patient Information ©2014 [employer]. ° ° °

## 2013-09-05 NOTE — Anesthesia Postprocedure Evaluation (Signed)
  Anesthesia Post-op Note  Patient: [name]  Procedure(s) Performed: Procedure(s) (LRB): ESOPHAGOGASTRODUODENOSCOPY (EGD) WITH PROPOFOL (N/A) COLONOSCOPY WITH PROPOFOL (N/A)  Patient Location: PACU  Anesthesia Type: MAC  Level of Consciousness: awake and alert   Airway and Oxygen Therapy: Patient Spontanous Breathing  Post-op Pain: mild  Post-op Assessment: Post-op Vital signs reviewed, Patient's Cardiovascular Status Stable, Respiratory Function Stable, Patent Airway and No signs of Nausea or vomiting  Last Vitals:  Filed Vitals:   09/05/13 0850  BP: 144/76  Pulse: 80  Temp:   Resp: 20    Post-op Vital Signs: stable   Complications: No apparent anesthesia complications

## 2013-09-05 NOTE — H&P (Signed)
  Problems: Epigastric pain. Globus sensation. Chronic heartburn. Screening colonoscopy.  History: The patient is a 53 year old female born 09/07/1960. She is scheduled to undergo her first screening colonoscopy with polypectomy to prevent colon cancer.  The patient has chronic heartburn which seems to be well-controlled on Nexium. She occasionally has a foreign body sensation in her throat (globus sensation) and has developed epigastric discomfort.  She is scheduled to undergo a diagnostic esophagogastroduodenoscopy.  Past medical history: Multiple orthopedic procedures. Lasix  eye surgery. Tonsillectomy. Cholecystectomy. Osteoarthritis of the knees. Chronic anxiety. Lumbar spinal stenosis. Asthma. Hypercholesterolemia. Hypertension. Type 2 diabetes mellitus.  Allergies: Peanuts oil. Actos. Metformin. Eggs. Influenza vaccination. Lisinopril.  Exam: The patient is alert and lying comfortably on the endoscopy stretcher. Lungs are clear to auscultation. Cardiac exam reveals a regular rhythm. Abdomen is soft and nontender to palpation.  Plan: Proceed with diagnostic esophagogastroduodenoscopy followed by screening colonoscopy.

## 2013-09-06 ENCOUNTER — Encounter (HOSPITAL_COMMUNITY): Payer: Self-pay | Admitting: Gastroenterology

## 2014-02-02 ENCOUNTER — Other Ambulatory Visit: Payer: Self-pay

## 2015-08-12 DIAGNOSIS — M75121 Complete rotator cuff tear or rupture of right shoulder, not specified as traumatic: Secondary | ICD-10-CM | POA: Diagnosis not present

## 2015-09-19 DIAGNOSIS — M25561 Pain in right knee: Secondary | ICD-10-CM | POA: Diagnosis not present

## 2015-09-19 DIAGNOSIS — M25562 Pain in left knee: Secondary | ICD-10-CM | POA: Diagnosis not present

## 2015-09-19 DIAGNOSIS — M1711 Unilateral primary osteoarthritis, right knee: Secondary | ICD-10-CM | POA: Diagnosis not present

## 2016-02-26 DIAGNOSIS — M25562 Pain in left knee: Secondary | ICD-10-CM | POA: Diagnosis not present

## 2016-02-26 DIAGNOSIS — M1711 Unilateral primary osteoarthritis, right knee: Secondary | ICD-10-CM | POA: Diagnosis not present

## 2016-02-26 DIAGNOSIS — M25561 Pain in right knee: Secondary | ICD-10-CM | POA: Diagnosis not present

## 2016-04-22 DIAGNOSIS — R05 Cough: Secondary | ICD-10-CM | POA: Diagnosis not present

## 2016-04-22 DIAGNOSIS — J45901 Unspecified asthma with (acute) exacerbation: Secondary | ICD-10-CM | POA: Diagnosis not present

## 2016-05-11 DIAGNOSIS — M79661 Pain in right lower leg: Secondary | ICD-10-CM | POA: Diagnosis not present

## 2016-05-11 DIAGNOSIS — Z79899 Other long term (current) drug therapy: Secondary | ICD-10-CM | POA: Diagnosis not present

## 2016-05-11 DIAGNOSIS — I10 Essential (primary) hypertension: Secondary | ICD-10-CM | POA: Diagnosis not present

## 2016-05-11 DIAGNOSIS — M791 Myalgia: Secondary | ICD-10-CM | POA: Diagnosis not present

## 2016-05-11 DIAGNOSIS — M79604 Pain in right leg: Secondary | ICD-10-CM | POA: Diagnosis not present

## 2016-05-12 DIAGNOSIS — M79604 Pain in right leg: Secondary | ICD-10-CM | POA: Diagnosis not present

## 2016-05-13 DIAGNOSIS — Z791 Long term (current) use of non-steroidal anti-inflammatories (NSAID): Secondary | ICD-10-CM | POA: Diagnosis not present

## 2016-05-13 DIAGNOSIS — I10 Essential (primary) hypertension: Secondary | ICD-10-CM | POA: Diagnosis not present

## 2016-05-13 DIAGNOSIS — Z780 Asymptomatic menopausal state: Secondary | ICD-10-CM | POA: Diagnosis not present

## 2016-05-13 DIAGNOSIS — Z86718 Personal history of other venous thrombosis and embolism: Secondary | ICD-10-CM | POA: Diagnosis not present

## 2016-05-13 DIAGNOSIS — M79604 Pain in right leg: Secondary | ICD-10-CM | POA: Diagnosis not present

## 2016-05-13 DIAGNOSIS — M79661 Pain in right lower leg: Secondary | ICD-10-CM | POA: Diagnosis not present

## 2016-05-13 DIAGNOSIS — J45909 Unspecified asthma, uncomplicated: Secondary | ICD-10-CM | POA: Diagnosis not present

## 2016-05-13 DIAGNOSIS — E119 Type 2 diabetes mellitus without complications: Secondary | ICD-10-CM | POA: Diagnosis not present

## 2016-05-13 DIAGNOSIS — M179 Osteoarthritis of knee, unspecified: Secondary | ICD-10-CM | POA: Diagnosis not present

## 2016-05-18 DIAGNOSIS — M62838 Other muscle spasm: Secondary | ICD-10-CM | POA: Diagnosis not present

## 2016-05-18 DIAGNOSIS — M25561 Pain in right knee: Secondary | ICD-10-CM | POA: Diagnosis not present

## 2016-05-18 DIAGNOSIS — M170 Bilateral primary osteoarthritis of knee: Secondary | ICD-10-CM | POA: Diagnosis not present

## 2016-06-03 DIAGNOSIS — M25562 Pain in left knee: Secondary | ICD-10-CM | POA: Diagnosis not present

## 2016-06-03 DIAGNOSIS — M25561 Pain in right knee: Secondary | ICD-10-CM | POA: Diagnosis not present

## 2016-06-15 DIAGNOSIS — E7800 Pure hypercholesterolemia, unspecified: Secondary | ICD-10-CM | POA: Diagnosis not present

## 2016-06-15 DIAGNOSIS — I10 Essential (primary) hypertension: Secondary | ICD-10-CM | POA: Diagnosis not present

## 2016-06-15 DIAGNOSIS — F329 Major depressive disorder, single episode, unspecified: Secondary | ICD-10-CM | POA: Diagnosis not present

## 2016-06-15 DIAGNOSIS — E119 Type 2 diabetes mellitus without complications: Secondary | ICD-10-CM | POA: Diagnosis not present

## 2016-07-15 DIAGNOSIS — Z6841 Body Mass Index (BMI) 40.0 and over, adult: Secondary | ICD-10-CM | POA: Diagnosis not present

## 2016-07-15 DIAGNOSIS — J45909 Unspecified asthma, uncomplicated: Secondary | ICD-10-CM | POA: Diagnosis not present

## 2016-07-15 DIAGNOSIS — E11618 Type 2 diabetes mellitus with other diabetic arthropathy: Secondary | ICD-10-CM | POA: Diagnosis not present

## 2016-07-15 DIAGNOSIS — Z01818 Encounter for other preprocedural examination: Secondary | ICD-10-CM | POA: Diagnosis not present

## 2016-07-15 DIAGNOSIS — I10 Essential (primary) hypertension: Secondary | ICD-10-CM | POA: Diagnosis not present

## 2016-07-28 DIAGNOSIS — Z7982 Long term (current) use of aspirin: Secondary | ICD-10-CM | POA: Diagnosis not present

## 2016-07-28 DIAGNOSIS — E119 Type 2 diabetes mellitus without complications: Secondary | ICD-10-CM | POA: Diagnosis not present

## 2016-07-28 DIAGNOSIS — M75121 Complete rotator cuff tear or rupture of right shoulder, not specified as traumatic: Secondary | ICD-10-CM | POA: Diagnosis not present

## 2016-07-28 DIAGNOSIS — M1711 Unilateral primary osteoarthritis, right knee: Secondary | ICD-10-CM | POA: Diagnosis not present

## 2016-07-28 DIAGNOSIS — Z96651 Presence of right artificial knee joint: Secondary | ICD-10-CM | POA: Diagnosis not present

## 2016-07-28 DIAGNOSIS — M654 Radial styloid tenosynovitis [de Quervain]: Secondary | ICD-10-CM | POA: Diagnosis not present

## 2016-07-28 DIAGNOSIS — Z86718 Personal history of other venous thrombosis and embolism: Secondary | ICD-10-CM | POA: Diagnosis not present

## 2016-07-28 DIAGNOSIS — Z471 Aftercare following joint replacement surgery: Secondary | ICD-10-CM | POA: Diagnosis not present

## 2016-07-28 DIAGNOSIS — Z791 Long term (current) use of non-steroidal anti-inflammatories (NSAID): Secondary | ICD-10-CM | POA: Diagnosis not present

## 2016-07-28 DIAGNOSIS — I10 Essential (primary) hypertension: Secondary | ICD-10-CM | POA: Diagnosis not present

## 2016-07-28 DIAGNOSIS — J45909 Unspecified asthma, uncomplicated: Secondary | ICD-10-CM | POA: Diagnosis not present

## 2016-07-28 DIAGNOSIS — Z79899 Other long term (current) drug therapy: Secondary | ICD-10-CM | POA: Diagnosis not present

## 2016-07-28 DIAGNOSIS — Z6841 Body Mass Index (BMI) 40.0 and over, adult: Secondary | ICD-10-CM | POA: Diagnosis not present

## 2016-07-28 DIAGNOSIS — F329 Major depressive disorder, single episode, unspecified: Secondary | ICD-10-CM | POA: Diagnosis not present

## 2016-07-28 DIAGNOSIS — K219 Gastro-esophageal reflux disease without esophagitis: Secondary | ICD-10-CM | POA: Diagnosis not present

## 2016-07-28 DIAGNOSIS — Z794 Long term (current) use of insulin: Secondary | ICD-10-CM | POA: Diagnosis not present

## 2016-07-28 DIAGNOSIS — E7800 Pure hypercholesterolemia, unspecified: Secondary | ICD-10-CM | POA: Diagnosis not present

## 2016-08-01 DIAGNOSIS — M1990 Unspecified osteoarthritis, unspecified site: Secondary | ICD-10-CM | POA: Diagnosis not present

## 2016-08-01 DIAGNOSIS — Z86718 Personal history of other venous thrombosis and embolism: Secondary | ICD-10-CM | POA: Diagnosis not present

## 2016-08-01 DIAGNOSIS — Z96651 Presence of right artificial knee joint: Secondary | ICD-10-CM | POA: Diagnosis not present

## 2016-08-01 DIAGNOSIS — Z7951 Long term (current) use of inhaled steroids: Secondary | ICD-10-CM | POA: Diagnosis not present

## 2016-08-01 DIAGNOSIS — Z794 Long term (current) use of insulin: Secondary | ICD-10-CM | POA: Diagnosis not present

## 2016-08-01 DIAGNOSIS — J45909 Unspecified asthma, uncomplicated: Secondary | ICD-10-CM | POA: Diagnosis not present

## 2016-08-01 DIAGNOSIS — Z471 Aftercare following joint replacement surgery: Secondary | ICD-10-CM | POA: Diagnosis not present

## 2016-08-01 DIAGNOSIS — K219 Gastro-esophageal reflux disease without esophagitis: Secondary | ICD-10-CM | POA: Diagnosis not present

## 2016-08-01 DIAGNOSIS — I10 Essential (primary) hypertension: Secondary | ICD-10-CM | POA: Diagnosis not present

## 2016-08-01 DIAGNOSIS — E119 Type 2 diabetes mellitus without complications: Secondary | ICD-10-CM | POA: Diagnosis not present

## 2016-08-04 DIAGNOSIS — Z471 Aftercare following joint replacement surgery: Secondary | ICD-10-CM | POA: Diagnosis not present

## 2016-08-04 DIAGNOSIS — E119 Type 2 diabetes mellitus without complications: Secondary | ICD-10-CM | POA: Diagnosis not present

## 2016-08-04 DIAGNOSIS — Z794 Long term (current) use of insulin: Secondary | ICD-10-CM | POA: Diagnosis not present

## 2016-08-04 DIAGNOSIS — Z96651 Presence of right artificial knee joint: Secondary | ICD-10-CM | POA: Diagnosis not present

## 2016-08-04 DIAGNOSIS — K219 Gastro-esophageal reflux disease without esophagitis: Secondary | ICD-10-CM | POA: Diagnosis not present

## 2016-08-04 DIAGNOSIS — J45909 Unspecified asthma, uncomplicated: Secondary | ICD-10-CM | POA: Diagnosis not present

## 2016-08-04 DIAGNOSIS — M1990 Unspecified osteoarthritis, unspecified site: Secondary | ICD-10-CM | POA: Diagnosis not present

## 2016-08-04 DIAGNOSIS — I10 Essential (primary) hypertension: Secondary | ICD-10-CM | POA: Diagnosis not present

## 2016-08-04 DIAGNOSIS — Z7951 Long term (current) use of inhaled steroids: Secondary | ICD-10-CM | POA: Diagnosis not present

## 2016-08-04 DIAGNOSIS — Z86718 Personal history of other venous thrombosis and embolism: Secondary | ICD-10-CM | POA: Diagnosis not present

## 2016-08-06 DIAGNOSIS — K219 Gastro-esophageal reflux disease without esophagitis: Secondary | ICD-10-CM | POA: Diagnosis not present

## 2016-08-06 DIAGNOSIS — E119 Type 2 diabetes mellitus without complications: Secondary | ICD-10-CM | POA: Diagnosis not present

## 2016-08-06 DIAGNOSIS — Z471 Aftercare following joint replacement surgery: Secondary | ICD-10-CM | POA: Diagnosis not present

## 2016-08-06 DIAGNOSIS — Z794 Long term (current) use of insulin: Secondary | ICD-10-CM | POA: Diagnosis not present

## 2016-08-06 DIAGNOSIS — Z7951 Long term (current) use of inhaled steroids: Secondary | ICD-10-CM | POA: Diagnosis not present

## 2016-08-06 DIAGNOSIS — M1990 Unspecified osteoarthritis, unspecified site: Secondary | ICD-10-CM | POA: Diagnosis not present

## 2016-08-06 DIAGNOSIS — J45909 Unspecified asthma, uncomplicated: Secondary | ICD-10-CM | POA: Diagnosis not present

## 2016-08-06 DIAGNOSIS — I10 Essential (primary) hypertension: Secondary | ICD-10-CM | POA: Diagnosis not present

## 2016-08-06 DIAGNOSIS — Z86718 Personal history of other venous thrombosis and embolism: Secondary | ICD-10-CM | POA: Diagnosis not present

## 2016-08-06 DIAGNOSIS — Z96651 Presence of right artificial knee joint: Secondary | ICD-10-CM | POA: Diagnosis not present

## 2016-08-10 DIAGNOSIS — M25561 Pain in right knee: Secondary | ICD-10-CM | POA: Diagnosis not present

## 2016-08-11 DIAGNOSIS — Z471 Aftercare following joint replacement surgery: Secondary | ICD-10-CM | POA: Diagnosis not present

## 2016-08-11 DIAGNOSIS — M1990 Unspecified osteoarthritis, unspecified site: Secondary | ICD-10-CM | POA: Diagnosis not present

## 2016-08-11 DIAGNOSIS — Z96651 Presence of right artificial knee joint: Secondary | ICD-10-CM | POA: Diagnosis not present

## 2016-08-11 DIAGNOSIS — E119 Type 2 diabetes mellitus without complications: Secondary | ICD-10-CM | POA: Diagnosis not present

## 2016-08-11 DIAGNOSIS — Z794 Long term (current) use of insulin: Secondary | ICD-10-CM | POA: Diagnosis not present

## 2016-08-11 DIAGNOSIS — K219 Gastro-esophageal reflux disease without esophagitis: Secondary | ICD-10-CM | POA: Diagnosis not present

## 2016-08-11 DIAGNOSIS — J45909 Unspecified asthma, uncomplicated: Secondary | ICD-10-CM | POA: Diagnosis not present

## 2016-08-11 DIAGNOSIS — I10 Essential (primary) hypertension: Secondary | ICD-10-CM | POA: Diagnosis not present

## 2016-08-11 DIAGNOSIS — Z86718 Personal history of other venous thrombosis and embolism: Secondary | ICD-10-CM | POA: Diagnosis not present

## 2016-08-11 DIAGNOSIS — Z7951 Long term (current) use of inhaled steroids: Secondary | ICD-10-CM | POA: Diagnosis not present

## 2016-08-17 DIAGNOSIS — M1990 Unspecified osteoarthritis, unspecified site: Secondary | ICD-10-CM | POA: Diagnosis not present

## 2016-08-17 DIAGNOSIS — J45909 Unspecified asthma, uncomplicated: Secondary | ICD-10-CM | POA: Diagnosis not present

## 2016-08-17 DIAGNOSIS — Z794 Long term (current) use of insulin: Secondary | ICD-10-CM | POA: Diagnosis not present

## 2016-08-17 DIAGNOSIS — E119 Type 2 diabetes mellitus without complications: Secondary | ICD-10-CM | POA: Diagnosis not present

## 2016-08-17 DIAGNOSIS — Z96651 Presence of right artificial knee joint: Secondary | ICD-10-CM | POA: Diagnosis not present

## 2016-08-17 DIAGNOSIS — I10 Essential (primary) hypertension: Secondary | ICD-10-CM | POA: Diagnosis not present

## 2016-08-17 DIAGNOSIS — K219 Gastro-esophageal reflux disease without esophagitis: Secondary | ICD-10-CM | POA: Diagnosis not present

## 2016-08-17 DIAGNOSIS — Z86718 Personal history of other venous thrombosis and embolism: Secondary | ICD-10-CM | POA: Diagnosis not present

## 2016-08-17 DIAGNOSIS — Z7951 Long term (current) use of inhaled steroids: Secondary | ICD-10-CM | POA: Diagnosis not present

## 2016-08-17 DIAGNOSIS — Z471 Aftercare following joint replacement surgery: Secondary | ICD-10-CM | POA: Diagnosis not present

## 2016-08-20 DIAGNOSIS — M1711 Unilateral primary osteoarthritis, right knee: Secondary | ICD-10-CM | POA: Diagnosis not present

## 2016-11-06 DIAGNOSIS — Z794 Long term (current) use of insulin: Secondary | ICD-10-CM | POA: Diagnosis not present

## 2016-11-06 DIAGNOSIS — E669 Obesity, unspecified: Secondary | ICD-10-CM | POA: Diagnosis not present

## 2016-11-06 DIAGNOSIS — E1165 Type 2 diabetes mellitus with hyperglycemia: Secondary | ICD-10-CM | POA: Diagnosis not present

## 2016-11-09 DIAGNOSIS — M25561 Pain in right knee: Secondary | ICD-10-CM | POA: Diagnosis not present

## 2016-11-09 DIAGNOSIS — M79645 Pain in left finger(s): Secondary | ICD-10-CM | POA: Diagnosis not present

## 2016-11-09 DIAGNOSIS — M25562 Pain in left knee: Secondary | ICD-10-CM | POA: Diagnosis not present

## 2018-08-23 DIAGNOSIS — E1165 Type 2 diabetes mellitus with hyperglycemia: Secondary | ICD-10-CM | POA: Diagnosis not present

## 2018-08-23 DIAGNOSIS — Z794 Long term (current) use of insulin: Secondary | ICD-10-CM | POA: Diagnosis not present

## 2018-08-23 DIAGNOSIS — E669 Obesity, unspecified: Secondary | ICD-10-CM | POA: Diagnosis not present

## 2018-08-23 DIAGNOSIS — I10 Essential (primary) hypertension: Secondary | ICD-10-CM | POA: Diagnosis not present

## 2018-09-07 DIAGNOSIS — E7800 Pure hypercholesterolemia, unspecified: Secondary | ICD-10-CM | POA: Diagnosis not present

## 2018-09-07 DIAGNOSIS — I10 Essential (primary) hypertension: Secondary | ICD-10-CM | POA: Diagnosis not present

## 2018-09-07 DIAGNOSIS — E119 Type 2 diabetes mellitus without complications: Secondary | ICD-10-CM | POA: Diagnosis not present

## 2018-09-07 DIAGNOSIS — R748 Abnormal levels of other serum enzymes: Secondary | ICD-10-CM | POA: Diagnosis not present

## 2019-01-02 DIAGNOSIS — M25531 Pain in right wrist: Secondary | ICD-10-CM | POA: Diagnosis not present

## 2019-01-02 DIAGNOSIS — M79641 Pain in right hand: Secondary | ICD-10-CM | POA: Diagnosis not present

## 2019-05-16 DIAGNOSIS — F331 Major depressive disorder, recurrent, moderate: Secondary | ICD-10-CM | POA: Diagnosis not present

## 2019-05-22 DIAGNOSIS — F331 Major depressive disorder, recurrent, moderate: Secondary | ICD-10-CM | POA: Diagnosis not present

## 2019-07-25 DIAGNOSIS — F331 Major depressive disorder, recurrent, moderate: Secondary | ICD-10-CM | POA: Diagnosis not present

## 2019-08-23 DIAGNOSIS — F331 Major depressive disorder, recurrent, moderate: Secondary | ICD-10-CM | POA: Diagnosis not present

## 2019-11-13 DIAGNOSIS — F331 Major depressive disorder, recurrent, moderate: Secondary | ICD-10-CM | POA: Diagnosis not present

## 2019-11-17 DIAGNOSIS — E1165 Type 2 diabetes mellitus with hyperglycemia: Secondary | ICD-10-CM | POA: Diagnosis not present

## 2019-11-17 DIAGNOSIS — E669 Obesity, unspecified: Secondary | ICD-10-CM | POA: Diagnosis not present

## 2019-11-17 DIAGNOSIS — Z794 Long term (current) use of insulin: Secondary | ICD-10-CM | POA: Diagnosis not present

## 2019-11-17 DIAGNOSIS — I10 Essential (primary) hypertension: Secondary | ICD-10-CM | POA: Diagnosis not present

## 2019-12-29 DIAGNOSIS — M25552 Pain in left hip: Secondary | ICD-10-CM | POA: Diagnosis not present

## 2019-12-29 DIAGNOSIS — M25562 Pain in left knee: Secondary | ICD-10-CM | POA: Diagnosis not present

## 2020-01-29 DIAGNOSIS — G4489 Other headache syndrome: Secondary | ICD-10-CM | POA: Diagnosis not present

## 2020-01-29 DIAGNOSIS — I10 Essential (primary) hypertension: Secondary | ICD-10-CM | POA: Diagnosis not present

## 2020-01-29 DIAGNOSIS — K219 Gastro-esophageal reflux disease without esophagitis: Secondary | ICD-10-CM | POA: Diagnosis not present

## 2020-01-29 DIAGNOSIS — I63521 Cerebral infarction due to unspecified occlusion or stenosis of right anterior cerebral artery: Secondary | ICD-10-CM | POA: Diagnosis not present

## 2020-01-29 DIAGNOSIS — Z86718 Personal history of other venous thrombosis and embolism: Secondary | ICD-10-CM | POA: Diagnosis not present

## 2020-01-29 DIAGNOSIS — R930 Abnormal findings on diagnostic imaging of skull and head, not elsewhere classified: Secondary | ICD-10-CM | POA: Diagnosis not present

## 2020-01-29 DIAGNOSIS — E119 Type 2 diabetes mellitus without complications: Secondary | ICD-10-CM | POA: Diagnosis not present

## 2020-01-29 DIAGNOSIS — Z794 Long term (current) use of insulin: Secondary | ICD-10-CM | POA: Diagnosis not present

## 2020-01-29 DIAGNOSIS — Z91010 Allergy to peanuts: Secondary | ICD-10-CM | POA: Diagnosis not present

## 2020-01-29 DIAGNOSIS — I63511 Cerebral infarction due to unspecified occlusion or stenosis of right middle cerebral artery: Secondary | ICD-10-CM | POA: Diagnosis not present

## 2020-01-29 DIAGNOSIS — I6381 Other cerebral infarction due to occlusion or stenosis of small artery: Secondary | ICD-10-CM | POA: Diagnosis not present

## 2020-01-29 DIAGNOSIS — E785 Hyperlipidemia, unspecified: Secondary | ICD-10-CM | POA: Diagnosis not present

## 2020-01-29 DIAGNOSIS — Z79899 Other long term (current) drug therapy: Secondary | ICD-10-CM | POA: Diagnosis not present

## 2020-01-29 DIAGNOSIS — I6622 Occlusion and stenosis of left posterior cerebral artery: Secondary | ICD-10-CM | POA: Diagnosis not present

## 2020-01-29 DIAGNOSIS — R42 Dizziness and giddiness: Secondary | ICD-10-CM | POA: Diagnosis not present

## 2020-01-29 DIAGNOSIS — I639 Cerebral infarction, unspecified: Secondary | ICD-10-CM | POA: Diagnosis not present

## 2020-01-29 DIAGNOSIS — R55 Syncope and collapse: Secondary | ICD-10-CM | POA: Diagnosis not present

## 2020-01-29 DIAGNOSIS — Z888 Allergy status to other drugs, medicaments and biological substances status: Secondary | ICD-10-CM | POA: Diagnosis not present

## 2020-01-29 DIAGNOSIS — I63531 Cerebral infarction due to unspecified occlusion or stenosis of right posterior cerebral artery: Secondary | ICD-10-CM | POA: Diagnosis not present

## 2020-01-29 DIAGNOSIS — J45909 Unspecified asthma, uncomplicated: Secondary | ICD-10-CM | POA: Diagnosis not present

## 2020-01-29 DIAGNOSIS — D72829 Elevated white blood cell count, unspecified: Secondary | ICD-10-CM | POA: Diagnosis not present

## 2020-01-29 DIAGNOSIS — M1990 Unspecified osteoarthritis, unspecified site: Secondary | ICD-10-CM | POA: Diagnosis not present

## 2020-01-29 DIAGNOSIS — F329 Major depressive disorder, single episode, unspecified: Secondary | ICD-10-CM | POA: Diagnosis not present

## 2020-01-30 DIAGNOSIS — J45909 Unspecified asthma, uncomplicated: Secondary | ICD-10-CM | POA: Diagnosis not present

## 2020-01-30 DIAGNOSIS — I6381 Other cerebral infarction due to occlusion or stenosis of small artery: Secondary | ICD-10-CM | POA: Diagnosis not present

## 2020-01-30 DIAGNOSIS — R930 Abnormal findings on diagnostic imaging of skull and head, not elsewhere classified: Secondary | ICD-10-CM | POA: Diagnosis not present

## 2020-01-30 DIAGNOSIS — M1990 Unspecified osteoarthritis, unspecified site: Secondary | ICD-10-CM | POA: Diagnosis not present

## 2020-01-31 DIAGNOSIS — I517 Cardiomegaly: Secondary | ICD-10-CM | POA: Diagnosis not present

## 2020-01-31 DIAGNOSIS — R55 Syncope and collapse: Secondary | ICD-10-CM | POA: Diagnosis not present

## 2020-01-31 DIAGNOSIS — I348 Other nonrheumatic mitral valve disorders: Secondary | ICD-10-CM | POA: Diagnosis not present

## 2020-01-31 DIAGNOSIS — Z794 Long term (current) use of insulin: Secondary | ICD-10-CM | POA: Diagnosis not present

## 2020-01-31 DIAGNOSIS — R930 Abnormal findings on diagnostic imaging of skull and head, not elsewhere classified: Secondary | ICD-10-CM | POA: Diagnosis not present

## 2020-01-31 DIAGNOSIS — E1165 Type 2 diabetes mellitus with hyperglycemia: Secondary | ICD-10-CM | POA: Diagnosis not present

## 2020-01-31 DIAGNOSIS — I6381 Other cerebral infarction due to occlusion or stenosis of small artery: Secondary | ICD-10-CM | POA: Diagnosis not present

## 2020-01-31 DIAGNOSIS — E785 Hyperlipidemia, unspecified: Secondary | ICD-10-CM | POA: Diagnosis not present

## 2020-02-05 DIAGNOSIS — F331 Major depressive disorder, recurrent, moderate: Secondary | ICD-10-CM | POA: Diagnosis not present

## 2020-02-07 DIAGNOSIS — I6381 Other cerebral infarction due to occlusion or stenosis of small artery: Secondary | ICD-10-CM | POA: Diagnosis not present

## 2020-02-07 DIAGNOSIS — R55 Syncope and collapse: Secondary | ICD-10-CM | POA: Diagnosis not present

## 2020-02-12 DIAGNOSIS — R930 Abnormal findings on diagnostic imaging of skull and head, not elsewhere classified: Secondary | ICD-10-CM | POA: Diagnosis not present

## 2020-02-12 DIAGNOSIS — I6381 Other cerebral infarction due to occlusion or stenosis of small artery: Secondary | ICD-10-CM | POA: Diagnosis not present

## 2020-02-12 DIAGNOSIS — K219 Gastro-esophageal reflux disease without esophagitis: Secondary | ICD-10-CM | POA: Diagnosis not present

## 2020-02-12 DIAGNOSIS — G4489 Other headache syndrome: Secondary | ICD-10-CM | POA: Diagnosis not present

## 2020-02-16 DIAGNOSIS — E669 Obesity, unspecified: Secondary | ICD-10-CM | POA: Diagnosis not present

## 2020-02-16 DIAGNOSIS — E1165 Type 2 diabetes mellitus with hyperglycemia: Secondary | ICD-10-CM | POA: Diagnosis not present

## 2020-02-16 DIAGNOSIS — Z794 Long term (current) use of insulin: Secondary | ICD-10-CM | POA: Diagnosis not present

## 2020-02-16 DIAGNOSIS — I10 Essential (primary) hypertension: Secondary | ICD-10-CM | POA: Diagnosis not present

## 2020-03-19 DIAGNOSIS — I6381 Other cerebral infarction due to occlusion or stenosis of small artery: Secondary | ICD-10-CM | POA: Diagnosis not present

## 2020-03-19 DIAGNOSIS — R55 Syncope and collapse: Secondary | ICD-10-CM | POA: Diagnosis not present

## 2020-04-01 DIAGNOSIS — I491 Atrial premature depolarization: Secondary | ICD-10-CM | POA: Diagnosis not present

## 2020-04-01 DIAGNOSIS — Z794 Long term (current) use of insulin: Secondary | ICD-10-CM | POA: Diagnosis not present

## 2020-04-01 DIAGNOSIS — I493 Ventricular premature depolarization: Secondary | ICD-10-CM | POA: Diagnosis not present

## 2020-04-01 DIAGNOSIS — I6381 Other cerebral infarction due to occlusion or stenosis of small artery: Secondary | ICD-10-CM | POA: Diagnosis not present

## 2020-04-01 DIAGNOSIS — I10 Essential (primary) hypertension: Secondary | ICD-10-CM | POA: Diagnosis not present

## 2020-04-01 DIAGNOSIS — E1165 Type 2 diabetes mellitus with hyperglycemia: Secondary | ICD-10-CM | POA: Diagnosis not present

## 2020-04-24 DIAGNOSIS — E1165 Type 2 diabetes mellitus with hyperglycemia: Secondary | ICD-10-CM | POA: Diagnosis not present

## 2020-04-24 DIAGNOSIS — E669 Obesity, unspecified: Secondary | ICD-10-CM | POA: Diagnosis not present

## 2020-04-24 DIAGNOSIS — Z794 Long term (current) use of insulin: Secondary | ICD-10-CM | POA: Diagnosis not present

## 2020-04-24 DIAGNOSIS — I10 Essential (primary) hypertension: Secondary | ICD-10-CM | POA: Diagnosis not present

## 2020-05-14 DIAGNOSIS — Z8673 Personal history of transient ischemic attack (TIA), and cerebral infarction without residual deficits: Secondary | ICD-10-CM | POA: Diagnosis not present

## 2020-05-14 DIAGNOSIS — G4489 Other headache syndrome: Secondary | ICD-10-CM | POA: Diagnosis not present

## 2020-05-14 DIAGNOSIS — R930 Abnormal findings on diagnostic imaging of skull and head, not elsewhere classified: Secondary | ICD-10-CM | POA: Diagnosis not present

## 2020-05-14 DIAGNOSIS — K219 Gastro-esophageal reflux disease without esophagitis: Secondary | ICD-10-CM | POA: Diagnosis not present

## 2020-06-26 DIAGNOSIS — F331 Major depressive disorder, recurrent, moderate: Secondary | ICD-10-CM | POA: Diagnosis not present

## 2020-07-15 DIAGNOSIS — R112 Nausea with vomiting, unspecified: Secondary | ICD-10-CM | POA: Diagnosis not present

## 2020-07-15 DIAGNOSIS — K5900 Constipation, unspecified: Secondary | ICD-10-CM | POA: Diagnosis not present

## 2020-07-15 DIAGNOSIS — K439 Ventral hernia without obstruction or gangrene: Secondary | ICD-10-CM | POA: Diagnosis not present

## 2020-07-15 DIAGNOSIS — R1013 Epigastric pain: Secondary | ICD-10-CM | POA: Diagnosis not present

## 2020-08-01 DIAGNOSIS — K760 Fatty (change of) liver, not elsewhere classified: Secondary | ICD-10-CM | POA: Diagnosis not present

## 2020-08-01 DIAGNOSIS — K439 Ventral hernia without obstruction or gangrene: Secondary | ICD-10-CM | POA: Diagnosis not present

## 2020-08-01 DIAGNOSIS — R1013 Epigastric pain: Secondary | ICD-10-CM | POA: Diagnosis not present

## 2020-08-01 DIAGNOSIS — K5900 Constipation, unspecified: Secondary | ICD-10-CM | POA: Diagnosis not present

## 2020-08-01 DIAGNOSIS — R112 Nausea with vomiting, unspecified: Secondary | ICD-10-CM | POA: Diagnosis not present

## 2020-08-01 DIAGNOSIS — K5730 Diverticulosis of large intestine without perforation or abscess without bleeding: Secondary | ICD-10-CM | POA: Diagnosis not present

## 2020-08-01 DIAGNOSIS — R160 Hepatomegaly, not elsewhere classified: Secondary | ICD-10-CM | POA: Diagnosis not present

## 2020-08-29 DIAGNOSIS — Z7982 Long term (current) use of aspirin: Secondary | ICD-10-CM | POA: Diagnosis not present

## 2020-08-29 DIAGNOSIS — F32A Depression, unspecified: Secondary | ICD-10-CM | POA: Diagnosis not present

## 2020-08-29 DIAGNOSIS — E119 Type 2 diabetes mellitus without complications: Secondary | ICD-10-CM | POA: Diagnosis not present

## 2020-08-29 DIAGNOSIS — I10 Essential (primary) hypertension: Secondary | ICD-10-CM | POA: Diagnosis not present

## 2020-08-29 DIAGNOSIS — Z6841 Body Mass Index (BMI) 40.0 and over, adult: Secondary | ICD-10-CM | POA: Diagnosis not present

## 2020-08-29 DIAGNOSIS — I491 Atrial premature depolarization: Secondary | ICD-10-CM | POA: Diagnosis not present

## 2020-08-29 DIAGNOSIS — J45909 Unspecified asthma, uncomplicated: Secondary | ICD-10-CM | POA: Diagnosis not present

## 2020-08-29 DIAGNOSIS — Z86718 Personal history of other venous thrombosis and embolism: Secondary | ICD-10-CM | POA: Diagnosis not present

## 2020-08-29 DIAGNOSIS — K2100 Gastro-esophageal reflux disease with esophagitis, without bleeding: Secondary | ICD-10-CM | POA: Diagnosis not present

## 2020-08-29 DIAGNOSIS — Z79899 Other long term (current) drug therapy: Secondary | ICD-10-CM | POA: Diagnosis not present

## 2020-08-29 DIAGNOSIS — R110 Nausea: Secondary | ICD-10-CM | POA: Diagnosis not present

## 2020-08-29 DIAGNOSIS — R112 Nausea with vomiting, unspecified: Secondary | ICD-10-CM | POA: Diagnosis not present

## 2020-08-29 DIAGNOSIS — M1711 Unilateral primary osteoarthritis, right knee: Secondary | ICD-10-CM | POA: Diagnosis not present

## 2020-08-29 DIAGNOSIS — K2970 Gastritis, unspecified, without bleeding: Secondary | ICD-10-CM | POA: Diagnosis not present

## 2020-08-29 DIAGNOSIS — K3189 Other diseases of stomach and duodenum: Secondary | ICD-10-CM | POA: Diagnosis not present

## 2020-08-29 DIAGNOSIS — Z8673 Personal history of transient ischemic attack (TIA), and cerebral infarction without residual deficits: Secondary | ICD-10-CM | POA: Diagnosis not present

## 2020-08-29 DIAGNOSIS — I493 Ventricular premature depolarization: Secondary | ICD-10-CM | POA: Diagnosis not present

## 2020-08-29 DIAGNOSIS — E785 Hyperlipidemia, unspecified: Secondary | ICD-10-CM | POA: Diagnosis not present

## 2020-09-19 DIAGNOSIS — E669 Obesity, unspecified: Secondary | ICD-10-CM | POA: Diagnosis not present

## 2020-09-19 DIAGNOSIS — Z794 Long term (current) use of insulin: Secondary | ICD-10-CM | POA: Diagnosis not present

## 2020-09-19 DIAGNOSIS — E1165 Type 2 diabetes mellitus with hyperglycemia: Secondary | ICD-10-CM | POA: Diagnosis not present

## 2020-09-19 DIAGNOSIS — I10 Essential (primary) hypertension: Secondary | ICD-10-CM | POA: Diagnosis not present

## 2020-10-28 DIAGNOSIS — R140 Abdominal distension (gaseous): Secondary | ICD-10-CM | POA: Diagnosis not present

## 2020-10-28 DIAGNOSIS — K219 Gastro-esophageal reflux disease without esophagitis: Secondary | ICD-10-CM | POA: Diagnosis not present

## 2020-10-28 DIAGNOSIS — R110 Nausea: Secondary | ICD-10-CM | POA: Diagnosis not present

## 2020-11-26 DIAGNOSIS — F331 Major depressive disorder, recurrent, moderate: Secondary | ICD-10-CM | POA: Diagnosis not present

## 2021-01-21 DIAGNOSIS — F331 Major depressive disorder, recurrent, moderate: Secondary | ICD-10-CM | POA: Diagnosis not present

## 2021-02-02 DIAGNOSIS — K2980 Duodenitis without bleeding: Secondary | ICD-10-CM | POA: Diagnosis not present

## 2021-02-02 DIAGNOSIS — K859 Acute pancreatitis without necrosis or infection, unspecified: Secondary | ICD-10-CM | POA: Diagnosis not present

## 2021-02-02 DIAGNOSIS — Z6841 Body Mass Index (BMI) 40.0 and over, adult: Secondary | ICD-10-CM | POA: Diagnosis not present

## 2021-02-02 DIAGNOSIS — K219 Gastro-esophageal reflux disease without esophagitis: Secondary | ICD-10-CM | POA: Diagnosis not present

## 2021-02-02 DIAGNOSIS — J90 Pleural effusion, not elsewhere classified: Secondary | ICD-10-CM | POA: Diagnosis not present

## 2021-02-02 DIAGNOSIS — G459 Transient cerebral ischemic attack, unspecified: Secondary | ICD-10-CM | POA: Diagnosis not present

## 2021-02-02 DIAGNOSIS — I4729 Other ventricular tachycardia: Secondary | ICD-10-CM | POA: Diagnosis not present

## 2021-02-02 DIAGNOSIS — M25562 Pain in left knee: Secondary | ICD-10-CM | POA: Diagnosis not present

## 2021-02-02 DIAGNOSIS — R0602 Shortness of breath: Secondary | ICD-10-CM | POA: Diagnosis not present

## 2021-02-02 DIAGNOSIS — I2510 Atherosclerotic heart disease of native coronary artery without angina pectoris: Secondary | ICD-10-CM | POA: Diagnosis not present

## 2021-02-02 DIAGNOSIS — I471 Supraventricular tachycardia: Secondary | ICD-10-CM | POA: Diagnosis not present

## 2021-02-02 DIAGNOSIS — J189 Pneumonia, unspecified organism: Secondary | ICD-10-CM | POA: Diagnosis not present

## 2021-02-02 DIAGNOSIS — E1165 Type 2 diabetes mellitus with hyperglycemia: Secondary | ICD-10-CM | POA: Diagnosis not present

## 2021-02-02 DIAGNOSIS — R0789 Other chest pain: Secondary | ICD-10-CM | POA: Diagnosis not present

## 2021-02-02 DIAGNOSIS — I639 Cerebral infarction, unspecified: Secondary | ICD-10-CM | POA: Diagnosis not present

## 2021-02-02 DIAGNOSIS — Y999 Unspecified external cause status: Secondary | ICD-10-CM | POA: Diagnosis not present

## 2021-02-02 DIAGNOSIS — D72829 Elevated white blood cell count, unspecified: Secondary | ICD-10-CM | POA: Diagnosis not present

## 2021-02-02 DIAGNOSIS — W010XXA Fall on same level from slipping, tripping and stumbling without subsequent striking against object, initial encounter: Secondary | ICD-10-CM | POA: Diagnosis not present

## 2021-02-02 DIAGNOSIS — I119 Hypertensive heart disease without heart failure: Secondary | ICD-10-CM | POA: Diagnosis not present

## 2021-02-02 DIAGNOSIS — Z043 Encounter for examination and observation following other accident: Secondary | ICD-10-CM | POA: Diagnosis not present

## 2021-02-02 DIAGNOSIS — E8881 Metabolic syndrome: Secondary | ICD-10-CM | POA: Diagnosis not present

## 2021-02-02 DIAGNOSIS — I517 Cardiomegaly: Secondary | ICD-10-CM | POA: Diagnosis not present

## 2021-02-02 DIAGNOSIS — I213 ST elevation (STEMI) myocardial infarction of unspecified site: Secondary | ICD-10-CM | POA: Diagnosis not present

## 2021-02-02 DIAGNOSIS — G8929 Other chronic pain: Secondary | ICD-10-CM | POA: Diagnosis not present

## 2021-02-02 DIAGNOSIS — R7401 Elevation of levels of liver transaminase levels: Secondary | ICD-10-CM | POA: Diagnosis not present

## 2021-02-02 DIAGNOSIS — K760 Fatty (change of) liver, not elsewhere classified: Secondary | ICD-10-CM | POA: Diagnosis not present

## 2021-02-02 DIAGNOSIS — M542 Cervicalgia: Secondary | ICD-10-CM | POA: Diagnosis not present

## 2021-02-02 DIAGNOSIS — K439 Ventral hernia without obstruction or gangrene: Secondary | ICD-10-CM | POA: Diagnosis not present

## 2021-02-02 DIAGNOSIS — I219 Acute myocardial infarction, unspecified: Secondary | ICD-10-CM | POA: Diagnosis not present

## 2021-02-02 DIAGNOSIS — Z794 Long term (current) use of insulin: Secondary | ICD-10-CM | POA: Diagnosis not present

## 2021-02-02 DIAGNOSIS — R748 Abnormal levels of other serum enzymes: Secondary | ICD-10-CM | POA: Diagnosis not present

## 2021-02-02 DIAGNOSIS — E785 Hyperlipidemia, unspecified: Secondary | ICD-10-CM | POA: Diagnosis not present

## 2021-02-02 DIAGNOSIS — M25462 Effusion, left knee: Secondary | ICD-10-CM | POA: Diagnosis not present

## 2021-02-02 DIAGNOSIS — N179 Acute kidney failure, unspecified: Secondary | ICD-10-CM | POA: Diagnosis not present

## 2021-02-02 DIAGNOSIS — Z20822 Contact with and (suspected) exposure to covid-19: Secondary | ICD-10-CM | POA: Diagnosis not present

## 2021-02-02 DIAGNOSIS — I5021 Acute systolic (congestive) heart failure: Secondary | ICD-10-CM | POA: Diagnosis not present

## 2021-02-02 DIAGNOSIS — I214 Non-ST elevation (NSTEMI) myocardial infarction: Secondary | ICD-10-CM | POA: Diagnosis not present

## 2021-02-02 DIAGNOSIS — I472 Ventricular tachycardia, unspecified: Secondary | ICD-10-CM | POA: Diagnosis not present

## 2021-02-02 DIAGNOSIS — I10 Essential (primary) hypertension: Secondary | ICD-10-CM | POA: Diagnosis not present

## 2021-02-02 DIAGNOSIS — I361 Nonrheumatic tricuspid (valve) insufficiency: Secondary | ICD-10-CM | POA: Diagnosis not present

## 2021-02-02 DIAGNOSIS — E876 Hypokalemia: Secondary | ICD-10-CM | POA: Diagnosis not present

## 2021-02-02 DIAGNOSIS — I2111 ST elevation (STEMI) myocardial infarction involving right coronary artery: Secondary | ICD-10-CM | POA: Diagnosis not present

## 2021-02-02 DIAGNOSIS — I110 Hypertensive heart disease with heart failure: Secondary | ICD-10-CM | POA: Diagnosis not present

## 2021-02-02 DIAGNOSIS — I249 Acute ischemic heart disease, unspecified: Secondary | ICD-10-CM | POA: Diagnosis not present

## 2021-02-02 DIAGNOSIS — I2119 ST elevation (STEMI) myocardial infarction involving other coronary artery of inferior wall: Secondary | ICD-10-CM | POA: Diagnosis not present

## 2021-02-02 DIAGNOSIS — R079 Chest pain, unspecified: Secondary | ICD-10-CM | POA: Diagnosis not present

## 2021-02-02 DIAGNOSIS — E119 Type 2 diabetes mellitus without complications: Secondary | ICD-10-CM | POA: Diagnosis not present

## 2021-02-02 DIAGNOSIS — I959 Hypotension, unspecified: Secondary | ICD-10-CM | POA: Diagnosis not present

## 2021-02-02 DIAGNOSIS — W19XXXA Unspecified fall, initial encounter: Secondary | ICD-10-CM | POA: Diagnosis not present

## 2021-02-02 DIAGNOSIS — I493 Ventricular premature depolarization: Secondary | ICD-10-CM | POA: Diagnosis not present

## 2021-02-02 DIAGNOSIS — Z743 Need for continuous supervision: Secondary | ICD-10-CM | POA: Diagnosis not present

## 2021-02-02 DIAGNOSIS — I440 Atrioventricular block, first degree: Secondary | ICD-10-CM | POA: Diagnosis not present

## 2021-02-13 DIAGNOSIS — R233 Spontaneous ecchymoses: Secondary | ICD-10-CM | POA: Diagnosis not present

## 2021-02-13 DIAGNOSIS — L989 Disorder of the skin and subcutaneous tissue, unspecified: Secondary | ICD-10-CM | POA: Diagnosis not present

## 2021-02-13 DIAGNOSIS — I213 ST elevation (STEMI) myocardial infarction of unspecified site: Secondary | ICD-10-CM | POA: Diagnosis not present

## 2021-02-14 DIAGNOSIS — R233 Spontaneous ecchymoses: Secondary | ICD-10-CM | POA: Diagnosis not present

## 2021-02-14 DIAGNOSIS — L989 Disorder of the skin and subcutaneous tissue, unspecified: Secondary | ICD-10-CM | POA: Diagnosis not present

## 2021-02-14 DIAGNOSIS — M25531 Pain in right wrist: Secondary | ICD-10-CM | POA: Diagnosis not present

## 2021-02-14 DIAGNOSIS — M25431 Effusion, right wrist: Secondary | ICD-10-CM | POA: Diagnosis not present

## 2021-02-18 DIAGNOSIS — J309 Allergic rhinitis, unspecified: Secondary | ICD-10-CM | POA: Diagnosis not present

## 2021-02-18 DIAGNOSIS — R059 Cough, unspecified: Secondary | ICD-10-CM | POA: Diagnosis not present

## 2021-02-18 DIAGNOSIS — R0989 Other specified symptoms and signs involving the circulatory and respiratory systems: Secondary | ICD-10-CM | POA: Diagnosis not present

## 2021-02-18 DIAGNOSIS — R0602 Shortness of breath: Secondary | ICD-10-CM | POA: Diagnosis not present

## 2021-02-19 DIAGNOSIS — R059 Cough, unspecified: Secondary | ICD-10-CM | POA: Diagnosis not present

## 2021-02-19 DIAGNOSIS — R5383 Other fatigue: Secondary | ICD-10-CM | POA: Diagnosis not present

## 2021-02-19 DIAGNOSIS — J189 Pneumonia, unspecified organism: Secondary | ICD-10-CM | POA: Diagnosis not present

## 2021-02-19 DIAGNOSIS — I10 Essential (primary) hypertension: Secondary | ICD-10-CM | POA: Diagnosis not present

## 2021-02-19 DIAGNOSIS — R0602 Shortness of breath: Secondary | ICD-10-CM | POA: Diagnosis not present

## 2021-02-21 DIAGNOSIS — R233 Spontaneous ecchymoses: Secondary | ICD-10-CM | POA: Diagnosis not present

## 2021-02-21 DIAGNOSIS — M25431 Effusion, right wrist: Secondary | ICD-10-CM | POA: Diagnosis not present

## 2021-02-21 DIAGNOSIS — R2231 Localized swelling, mass and lump, right upper limb: Secondary | ICD-10-CM | POA: Diagnosis not present

## 2021-02-21 DIAGNOSIS — M25531 Pain in right wrist: Secondary | ICD-10-CM | POA: Diagnosis not present

## 2021-02-24 DIAGNOSIS — R2231 Localized swelling, mass and lump, right upper limb: Secondary | ICD-10-CM | POA: Diagnosis not present

## 2021-02-24 DIAGNOSIS — M25531 Pain in right wrist: Secondary | ICD-10-CM | POA: Diagnosis not present

## 2021-02-24 DIAGNOSIS — R233 Spontaneous ecchymoses: Secondary | ICD-10-CM | POA: Diagnosis not present

## 2021-02-24 DIAGNOSIS — L989 Disorder of the skin and subcutaneous tissue, unspecified: Secondary | ICD-10-CM | POA: Diagnosis not present

## 2021-02-25 DIAGNOSIS — R059 Cough, unspecified: Secondary | ICD-10-CM | POA: Diagnosis not present

## 2021-02-25 DIAGNOSIS — E785 Hyperlipidemia, unspecified: Secondary | ICD-10-CM | POA: Diagnosis not present

## 2021-02-25 DIAGNOSIS — M24641 Ankylosis, right hand: Secondary | ICD-10-CM | POA: Diagnosis not present

## 2021-02-25 DIAGNOSIS — I2510 Atherosclerotic heart disease of native coronary artery without angina pectoris: Secondary | ICD-10-CM | POA: Diagnosis not present

## 2021-02-25 DIAGNOSIS — Z955 Presence of coronary angioplasty implant and graft: Secondary | ICD-10-CM | POA: Diagnosis not present

## 2021-02-25 DIAGNOSIS — E1169 Type 2 diabetes mellitus with other specified complication: Secondary | ICD-10-CM | POA: Diagnosis not present

## 2021-02-25 DIAGNOSIS — R600 Localized edema: Secondary | ICD-10-CM | POA: Diagnosis not present

## 2021-02-25 DIAGNOSIS — Z8673 Personal history of transient ischemic attack (TIA), and cerebral infarction without residual deficits: Secondary | ICD-10-CM | POA: Diagnosis not present

## 2021-02-25 DIAGNOSIS — M25531 Pain in right wrist: Secondary | ICD-10-CM | POA: Diagnosis not present

## 2021-02-25 DIAGNOSIS — R739 Hyperglycemia, unspecified: Secondary | ICD-10-CM | POA: Diagnosis not present

## 2021-02-25 DIAGNOSIS — I252 Old myocardial infarction: Secondary | ICD-10-CM | POA: Diagnosis not present

## 2021-02-25 DIAGNOSIS — E119 Type 2 diabetes mellitus without complications: Secondary | ICD-10-CM | POA: Diagnosis not present

## 2021-02-25 DIAGNOSIS — M24631 Ankylosis, right wrist: Secondary | ICD-10-CM | POA: Diagnosis not present

## 2021-02-25 DIAGNOSIS — M25431 Effusion, right wrist: Secondary | ICD-10-CM | POA: Diagnosis not present

## 2021-02-25 DIAGNOSIS — J45909 Unspecified asthma, uncomplicated: Secondary | ICD-10-CM | POA: Diagnosis not present

## 2021-02-28 DIAGNOSIS — L0390 Cellulitis, unspecified: Secondary | ICD-10-CM | POA: Diagnosis not present

## 2021-02-28 DIAGNOSIS — R2231 Localized swelling, mass and lump, right upper limb: Secondary | ICD-10-CM | POA: Diagnosis not present

## 2021-02-28 DIAGNOSIS — M25531 Pain in right wrist: Secondary | ICD-10-CM | POA: Diagnosis not present

## 2021-02-28 DIAGNOSIS — R0602 Shortness of breath: Secondary | ICD-10-CM | POA: Diagnosis not present

## 2021-03-03 DIAGNOSIS — Z7689 Persons encountering health services in other specified circumstances: Secondary | ICD-10-CM | POA: Diagnosis not present

## 2021-03-03 DIAGNOSIS — R2243 Localized swelling, mass and lump, lower limb, bilateral: Secondary | ICD-10-CM | POA: Diagnosis not present

## 2021-03-03 DIAGNOSIS — L0390 Cellulitis, unspecified: Secondary | ICD-10-CM | POA: Diagnosis not present

## 2021-03-06 DIAGNOSIS — R059 Cough, unspecified: Secondary | ICD-10-CM | POA: Diagnosis not present

## 2021-03-06 DIAGNOSIS — R0981 Nasal congestion: Secondary | ICD-10-CM | POA: Diagnosis not present

## 2021-03-06 DIAGNOSIS — R0982 Postnasal drip: Secondary | ICD-10-CM | POA: Diagnosis not present

## 2021-03-06 DIAGNOSIS — J309 Allergic rhinitis, unspecified: Secondary | ICD-10-CM | POA: Diagnosis not present

## 2021-03-10 DIAGNOSIS — R070 Pain in throat: Secondary | ICD-10-CM | POA: Diagnosis not present

## 2021-03-10 DIAGNOSIS — R059 Cough, unspecified: Secondary | ICD-10-CM | POA: Diagnosis not present

## 2021-03-10 DIAGNOSIS — R0982 Postnasal drip: Secondary | ICD-10-CM | POA: Diagnosis not present

## 2021-03-10 DIAGNOSIS — R0981 Nasal congestion: Secondary | ICD-10-CM | POA: Diagnosis not present

## 2021-03-17 DIAGNOSIS — R059 Cough, unspecified: Secondary | ICD-10-CM | POA: Diagnosis not present

## 2021-03-17 DIAGNOSIS — R0602 Shortness of breath: Secondary | ICD-10-CM | POA: Diagnosis not present

## 2021-03-17 DIAGNOSIS — R0981 Nasal congestion: Secondary | ICD-10-CM | POA: Diagnosis not present

## 2021-03-17 DIAGNOSIS — R070 Pain in throat: Secondary | ICD-10-CM | POA: Diagnosis not present

## 2021-03-18 DIAGNOSIS — J90 Pleural effusion, not elsewhere classified: Secondary | ICD-10-CM | POA: Diagnosis not present

## 2021-03-18 DIAGNOSIS — J189 Pneumonia, unspecified organism: Secondary | ICD-10-CM | POA: Diagnosis not present

## 2021-03-18 DIAGNOSIS — Z7689 Persons encountering health services in other specified circumstances: Secondary | ICD-10-CM | POA: Diagnosis not present

## 2021-03-18 DIAGNOSIS — F331 Major depressive disorder, recurrent, moderate: Secondary | ICD-10-CM | POA: Diagnosis not present

## 2021-04-01 DIAGNOSIS — R0602 Shortness of breath: Secondary | ICD-10-CM | POA: Diagnosis not present

## 2021-04-01 DIAGNOSIS — R059 Cough, unspecified: Secondary | ICD-10-CM | POA: Diagnosis not present

## 2021-04-01 DIAGNOSIS — R2243 Localized swelling, mass and lump, lower limb, bilateral: Secondary | ICD-10-CM | POA: Diagnosis not present

## 2021-04-02 DIAGNOSIS — R0602 Shortness of breath: Secondary | ICD-10-CM | POA: Diagnosis not present

## 2021-04-02 DIAGNOSIS — Z79899 Other long term (current) drug therapy: Secondary | ICD-10-CM | POA: Diagnosis not present

## 2021-04-02 DIAGNOSIS — R059 Cough, unspecified: Secondary | ICD-10-CM | POA: Diagnosis not present

## 2021-04-02 DIAGNOSIS — J90 Pleural effusion, not elsewhere classified: Secondary | ICD-10-CM | POA: Diagnosis not present

## 2021-04-02 DIAGNOSIS — R2243 Localized swelling, mass and lump, lower limb, bilateral: Secondary | ICD-10-CM | POA: Diagnosis not present

## 2021-04-03 DIAGNOSIS — E119 Type 2 diabetes mellitus without complications: Secondary | ICD-10-CM | POA: Diagnosis not present

## 2021-04-03 DIAGNOSIS — J189 Pneumonia, unspecified organism: Secondary | ICD-10-CM | POA: Diagnosis not present

## 2021-04-03 DIAGNOSIS — J45909 Unspecified asthma, uncomplicated: Secondary | ICD-10-CM | POA: Diagnosis not present

## 2021-04-03 DIAGNOSIS — D509 Iron deficiency anemia, unspecified: Secondary | ICD-10-CM | POA: Diagnosis not present

## 2021-04-04 DIAGNOSIS — R300 Dysuria: Secondary | ICD-10-CM | POA: Diagnosis not present

## 2023-03-21 DEATH — deceased
